# Patient Record
Sex: Female | Born: 1975
Health system: Southern US, Community
[De-identification: ages and names within clinical notes are randomized; demographics above are authoritative.]

## PROBLEM LIST (undated history)

## (undated) DIAGNOSIS — I776 Arteritis, unspecified: Secondary | ICD-10-CM

## (undated) DIAGNOSIS — N63 Unspecified lump in unspecified breast: Secondary | ICD-10-CM

## (undated) DIAGNOSIS — D849 Immunodeficiency, unspecified: Secondary | ICD-10-CM

## (undated) DIAGNOSIS — R51 Headache: Secondary | ICD-10-CM

## (undated) DIAGNOSIS — R609 Edema, unspecified: Secondary | ICD-10-CM

## (undated) DIAGNOSIS — O09529 Supervision of elderly multigravida, unspecified trimester: Secondary | ICD-10-CM

## (undated) DIAGNOSIS — F909 Attention-deficit hyperactivity disorder, unspecified type: Secondary | ICD-10-CM

## (undated) DIAGNOSIS — E669 Obesity, unspecified: Secondary | ICD-10-CM

## (undated) DIAGNOSIS — I8390 Asymptomatic varicose veins of unspecified lower extremity: Secondary | ICD-10-CM

## (undated) DIAGNOSIS — I10 Essential (primary) hypertension: Secondary | ICD-10-CM

## (undated) DIAGNOSIS — D649 Anemia, unspecified: Secondary | ICD-10-CM

## (undated) DIAGNOSIS — H547 Unspecified visual loss: Secondary | ICD-10-CM

## (undated) HISTORY — DX: Asymptomatic varicose veins of unspecified lower extremity: I83.90

## (undated) HISTORY — DX: Unspecified lump in unspecified breast: N63.0

## (undated) HISTORY — DX: Obesity, unspecified: E66.9

## (undated) HISTORY — PX: CHOLECYSTECTOMY: SHX55

## (undated) HISTORY — DX: Anemia, unspecified: D64.9

## (undated) HISTORY — DX: Supervision of elderly multigravida, unspecified trimester: O09.529

## (undated) HISTORY — DX: Headache: R51

## (undated) HISTORY — PX: COLPOSCOPY: SHX161

## (undated) HISTORY — DX: Edema, unspecified: R60.9

## (undated) HISTORY — PX: CLOSED REDUCTION SHOULDER DISLOCATION: SUR242

---

## 2002-01-01 ENCOUNTER — Encounter: Admission: RE | Admit: 2002-01-01 | Discharge: 2002-03-22 | Payer: Self-pay | Admitting: Orthopedic Surgery

## 2003-01-27 ENCOUNTER — Inpatient Hospital Stay (HOSPITAL_COMMUNITY): Admission: AD | Admit: 2003-01-27 | Discharge: 2003-01-27 | Payer: Self-pay | Admitting: Obstetrics

## 2003-02-18 ENCOUNTER — Encounter: Admission: RE | Admit: 2003-02-18 | Discharge: 2003-02-18 | Payer: Self-pay | Admitting: Gastroenterology

## 2003-02-20 ENCOUNTER — Encounter: Admission: RE | Admit: 2003-02-20 | Discharge: 2003-02-20 | Payer: Self-pay | Admitting: Gastroenterology

## 2003-03-20 ENCOUNTER — Ambulatory Visit (HOSPITAL_COMMUNITY): Admission: RE | Admit: 2003-03-20 | Discharge: 2003-03-20 | Payer: Self-pay | Admitting: Gastroenterology

## 2003-03-20 ENCOUNTER — Encounter (INDEPENDENT_AMBULATORY_CARE_PROVIDER_SITE_OTHER): Payer: Self-pay | Admitting: *Deleted

## 2003-07-03 ENCOUNTER — Ambulatory Visit (HOSPITAL_COMMUNITY): Admission: RE | Admit: 2003-07-03 | Discharge: 2003-07-03 | Payer: Self-pay | Admitting: Obstetrics

## 2005-02-28 ENCOUNTER — Encounter: Admission: RE | Admit: 2005-02-28 | Discharge: 2005-02-28 | Payer: Self-pay | Admitting: Occupational Medicine

## 2005-03-22 ENCOUNTER — Encounter: Admission: RE | Admit: 2005-03-22 | Discharge: 2005-04-04 | Payer: Self-pay | Admitting: Occupational Medicine

## 2005-05-18 ENCOUNTER — Encounter: Admission: RE | Admit: 2005-05-18 | Discharge: 2005-08-16 | Payer: Self-pay | Admitting: Occupational Medicine

## 2007-05-31 ENCOUNTER — Encounter: Admission: RE | Admit: 2007-05-31 | Discharge: 2007-05-31 | Payer: Self-pay | Admitting: Obstetrics

## 2008-06-11 ENCOUNTER — Encounter: Payer: Self-pay | Admitting: Family Medicine

## 2008-06-18 ENCOUNTER — Ambulatory Visit: Payer: Self-pay | Admitting: Family Medicine

## 2008-06-18 DIAGNOSIS — R609 Edema, unspecified: Secondary | ICD-10-CM | POA: Insufficient documentation

## 2008-06-19 LAB — CONVERTED CEMR LAB
AST: 34 units/L (ref 0–37)
Basophils Absolute: 0 10*3/uL (ref 0.0–0.1)
CO2: 30 meq/L (ref 19–32)
Chloride: 112 meq/L (ref 96–112)
Creatinine, Ser: 0.9 mg/dL (ref 0.4–1.2)
Eosinophils Absolute: 0.3 10*3/uL (ref 0.0–0.7)
HCT: 36 % (ref 36.0–46.0)
LDL Cholesterol: 87 mg/dL (ref 0–99)
Lymphs Abs: 2.2 10*3/uL (ref 0.7–4.0)
MCHC: 34.2 g/dL (ref 30.0–36.0)
Monocytes Relative: 5.6 % (ref 3.0–12.0)
Platelets: 202 10*3/uL (ref 150.0–400.0)
RDW: 12.9 % (ref 11.5–14.6)
Sodium: 143 meq/L (ref 135–145)
TSH: 4.34 microintl units/mL (ref 0.35–5.50)
Total Bilirubin: 0.8 mg/dL (ref 0.3–1.2)
Total CHOL/HDL Ratio: 3
Triglycerides: 81 mg/dL (ref 0.0–149.0)
WBC: 7 10*3/uL (ref 4.5–10.5)

## 2008-07-30 ENCOUNTER — Ambulatory Visit: Payer: Self-pay | Admitting: Family Medicine

## 2008-07-30 DIAGNOSIS — E876 Hypokalemia: Secondary | ICD-10-CM | POA: Insufficient documentation

## 2008-07-31 ENCOUNTER — Encounter (INDEPENDENT_AMBULATORY_CARE_PROVIDER_SITE_OTHER): Payer: Self-pay | Admitting: *Deleted

## 2008-07-31 LAB — CONVERTED CEMR LAB: Potassium: 3.3 meq/L — ABNORMAL LOW (ref 3.5–5.1)

## 2008-08-07 ENCOUNTER — Telehealth (INDEPENDENT_AMBULATORY_CARE_PROVIDER_SITE_OTHER): Payer: Self-pay | Admitting: *Deleted

## 2008-08-25 ENCOUNTER — Telehealth (INDEPENDENT_AMBULATORY_CARE_PROVIDER_SITE_OTHER): Payer: Self-pay | Admitting: *Deleted

## 2008-10-08 ENCOUNTER — Encounter (INDEPENDENT_AMBULATORY_CARE_PROVIDER_SITE_OTHER): Payer: Self-pay | Admitting: *Deleted

## 2008-10-08 ENCOUNTER — Ambulatory Visit: Payer: Self-pay | Admitting: Family Medicine

## 2008-10-08 LAB — CONVERTED CEMR LAB: Potassium: 4.2 meq/L (ref 3.5–5.1)

## 2008-10-21 ENCOUNTER — Ambulatory Visit: Payer: Self-pay | Admitting: Family Medicine

## 2008-11-03 ENCOUNTER — Telehealth (INDEPENDENT_AMBULATORY_CARE_PROVIDER_SITE_OTHER): Payer: Self-pay | Admitting: *Deleted

## 2008-11-03 DIAGNOSIS — G43909 Migraine, unspecified, not intractable, without status migrainosus: Secondary | ICD-10-CM | POA: Insufficient documentation

## 2008-11-05 ENCOUNTER — Encounter: Payer: Self-pay | Admitting: Internal Medicine

## 2009-01-22 ENCOUNTER — Telehealth (INDEPENDENT_AMBULATORY_CARE_PROVIDER_SITE_OTHER): Payer: Self-pay | Admitting: *Deleted

## 2009-01-26 ENCOUNTER — Ambulatory Visit: Payer: Self-pay | Admitting: Family Medicine

## 2009-01-26 LAB — CONVERTED CEMR LAB: Beta hcg, urine, semiquantitative: NEGATIVE

## 2009-05-28 ENCOUNTER — Ambulatory Visit: Payer: Self-pay | Admitting: Family Medicine

## 2009-05-28 ENCOUNTER — Other Ambulatory Visit: Admission: RE | Admit: 2009-05-28 | Discharge: 2009-05-28 | Payer: Self-pay | Admitting: Family Medicine

## 2009-05-28 DIAGNOSIS — N644 Mastodynia: Secondary | ICD-10-CM | POA: Insufficient documentation

## 2009-05-28 DIAGNOSIS — J309 Allergic rhinitis, unspecified: Secondary | ICD-10-CM | POA: Insufficient documentation

## 2009-05-28 LAB — CONVERTED CEMR LAB: Beta hcg, urine, semiquantitative: NEGATIVE

## 2009-06-04 ENCOUNTER — Telehealth (INDEPENDENT_AMBULATORY_CARE_PROVIDER_SITE_OTHER): Payer: Self-pay | Admitting: *Deleted

## 2009-11-26 ENCOUNTER — Ambulatory Visit: Payer: Self-pay | Admitting: Family Medicine

## 2009-11-26 DIAGNOSIS — F988 Other specified behavioral and emotional disorders with onset usually occurring in childhood and adolescence: Secondary | ICD-10-CM | POA: Insufficient documentation

## 2009-11-27 LAB — CONVERTED CEMR LAB
ALT: 16 units/L (ref 0–35)
Albumin: 4 g/dL (ref 3.5–5.2)
Alkaline Phosphatase: 71 units/L (ref 39–117)
Basophils Relative: 0.4 % (ref 0.0–3.0)
CO2: 28 meq/L (ref 19–32)
Chloride: 105 meq/L (ref 96–112)
Eosinophils Absolute: 0.3 10*3/uL (ref 0.0–0.7)
Eosinophils Relative: 3 % (ref 0.0–5.0)
HCT: 41.4 % (ref 36.0–46.0)
Hemoglobin: 14 g/dL (ref 12.0–15.0)
LDL Cholesterol: 122 mg/dL — ABNORMAL HIGH (ref 0–99)
MCHC: 33.9 g/dL (ref 30.0–36.0)
MCV: 93.5 fL (ref 78.0–100.0)
Monocytes Absolute: 0.5 10*3/uL (ref 0.1–1.0)
Neutro Abs: 7 10*3/uL (ref 1.4–7.7)
Potassium: 3.7 meq/L (ref 3.5–5.1)
RBC: 4.43 M/uL (ref 3.87–5.11)
Sodium: 142 meq/L (ref 135–145)
Total CHOL/HDL Ratio: 3
Total Protein: 7.1 g/dL (ref 6.0–8.3)
Triglycerides: 41 mg/dL (ref 0.0–149.0)
Vit D, 25-Hydroxy: 21 ng/mL — ABNORMAL LOW (ref 30–89)
WBC: 10.7 10*3/uL — ABNORMAL HIGH (ref 4.5–10.5)

## 2009-12-25 ENCOUNTER — Telehealth (INDEPENDENT_AMBULATORY_CARE_PROVIDER_SITE_OTHER): Payer: Self-pay | Admitting: *Deleted

## 2010-02-06 ENCOUNTER — Encounter: Payer: Self-pay | Admitting: Obstetrics & Gynecology

## 2010-02-06 ENCOUNTER — Encounter: Payer: Self-pay | Admitting: Gastroenterology

## 2010-02-07 ENCOUNTER — Encounter: Payer: Self-pay | Admitting: Gastroenterology

## 2010-02-09 ENCOUNTER — Telehealth (INDEPENDENT_AMBULATORY_CARE_PROVIDER_SITE_OTHER): Payer: Self-pay | Admitting: *Deleted

## 2010-02-16 NOTE — Assessment & Plan Note (Signed)
Summary: pap/kdc   Vital Signs:  Patient profile:   35 year old female Height:      65 inches Weight:      205 pounds BMI:     34.24 Pulse rate:   86 / minute BP sitting:   112 / 80  (left arm)  Vitals Entered By: Doristine Devoid (May 28, 2009 8:12 AM) CC: pap only   History of Present Illness: 35 yo woman here today for pap.  had CPE 7/10, pap 5/10.  no longer seeing Dr Clearance Coots.  not currently on birth control.  has not had period since 3/11.  breasts now very sore.  ? pregnancy.  pt also reports seasonal allergies are bothersome.  zyrtec provides relief but only temporary.  says that 5-6 hrs after taking meds will have itchy throat, nasal congestion.  Current Medications (verified): 1)  None  Allergies (verified): No Known Drug Allergies  Past History:  Past Medical History: Last updated: 07/30/2008 Edema  hx of benign breast mass  Social History: Last updated: 07/30/2008 married, 2 children works as Systems developer at [employer]  Review of Systems      See HPI  Physical Exam  General:  Well-developed,well-nourished,in no acute distress; alert,appropriate and cooperative throughout examination Nose:  mildly edematous turbinates Mouth:  + PND Breasts:  lump in L upper outer quadrant- pt reports this has been worked up in the past, benign R breast normal Genitalia:  Pelvic Exam:        External: normal female genitalia without lesions or masses        Vagina: normal without lesions or masses        Cervix: normal without lesions or masses        Adnexa: normal bimanual exam without masses or fullness        Uterus: normal by palpation        Pap smear: performed   Impression & Recommendations:  Problem # 1:  ROUTINE GYNECOLOGICAL EXAMINATION (ICD-V72.31) Assessment New breast and pelvic exam WNL.  breast lump on L has been worked up w/ mammogram and ultrasound- benign.  pap collected.  Problem # 2:  BREAST PAIN (ICD-611.71) Assessment: New  given amenorrhea and  breast pain checked Upreg- negative.  no abnormalities on breast exam w/ exception of known L breast lump.  ibuprofen as needed.  if pain does not improve as cycles normalize pt is to let me know.  Orders: Urine Pregnancy Test  (56387)  Problem # 3:  ALLERGIC RHINITIS (ICD-477.9) Assessment: New given pt's sxs and edematous turbinates on exam will start nasal steroid in addition to Zyrtec. Her updated medication list for this problem includes:    Nasonex 50 Mcg/act Susp (Mometasone furoate) .Marland Kitchen... 2 sprays each nostril once daily  Complete Medication List: 1)  Nasonex 50 Mcg/act Susp (Mometasone furoate) .... 2 sprays each nostril once daily  Patient Instructions: 1)  You are due for your complete physical in July- do not eat before this appt 2)  Take ibuprofen for your breast pain- if it worsens or doesn't improve as your cycle normalizes, please let me know 3)  Continue the 10mg  Zyrtec and add the Nasonex daily- this will decrease your congestion and post nasal drip 4)  Call with any questions or concerns 5)  Have a great summer!! Prescriptions: NASONEX 50 MCG/ACT SUSP (MOMETASONE FUROATE) 2 sprays each nostril once daily  #1 x 3   Entered and Authorized by:   Neena Rhymes MD   Signed by:  Neena Rhymes MD on 05/28/2009   Method used:   Electronically to        [id number]* (retail)       [address] [place], [place]  [address]       Ph: [phone]       Fax: [phone]   RxID:   [id number]   Laboratory Results   Urine Tests      Urine HCG: negative    Laboratory Results   Urine Tests      Urine HCG: negative

## 2010-02-16 NOTE — Progress Notes (Signed)
Summary: increase concerta  Phone Note Call from Patient Call back at Work Phone [phone] Call back at ext [phone]   Caller: Patient Summary of Call: Spoke w/ patient would like increase in Concerta says she first take in the am and for the first 2 weeks it was lasting until end of work day now feels like its wearing off around 2:30 and co workers have noticed also.  Initial call taken by: Doristine Devoid CMA,  December 25, 2009 2:59 PM  Follow-up for Phone Call        if sxs are still well controlled in the morning we don't need to go up on the med we need to add a short acting pill in addition to what she's taking.  we can start Ritalin 10mg  for pt to take between 1-2pm.  #30 Follow-up by: Neena Rhymes MD,  December 25, 2009 3:24 PM  Additional Follow-up for Phone Call Additional follow up Details #1::        spoke w/ patient says she doesn't want to take ritaling but would like to add 18mg  of concerta informed patient that we could do this although this would put her at max dose and we wouldn't have any room to move if up if she doesn't respond to dose. patient ok'd information and still would like to try. Additional Follow-up by: Doristine Devoid CMA,  December 25, 2009 3:33 PM    New/Updated Medications: RITALIN 10 MG TABS (METHYLPHENIDATE HCL) 1 tab daily as directed CONCERTA 18 MG CR-TABS (METHYLPHENIDATE HCL) take one tablet as directed Prescriptions: CONCERTA 18 MG CR-TABS (METHYLPHENIDATE HCL) take one tablet as directed  #30 x 0   Entered by:   Doristine Devoid CMA   Authorized by:   Neena Rhymes MD   Signed by:   Doristine Devoid CMA on 12/25/2009   Method used:   Print then Give to Patient   RxID:   [id number] RITALIN 10 MG TABS (METHYLPHENIDATE HCL) 1 tab daily as directed  #30 x 0   Entered by:   Doristine Devoid CMA   Authorized by:   Neena Rhymes MD   Signed by:   Doristine Devoid CMA on 12/25/2009   Method used:   Printed then faxed to ...       [id number]* (retail)       [address] [place], [place]  [address]       Ph: [phone]       Fax: [phone]   RxID:   [id number] RITALIN 10 MG TABS (METHYLPHENIDATE HCL) 1 tab daily as directed  #30 x 0   Entered and Authorized by:   Neena Rhymes MD   Signed by:   Neena Rhymes MD on 12/25/2009   Method used:   Historical   RxID:   [id number]

## 2010-02-16 NOTE — Assessment & Plan Note (Signed)
Summary: PAP-IUD/CDJ   Vital Signs:  Patient profile:   35 year old female Height:      65 inches Weight:      195.4 pounds BMI:     32.63 Pulse rate:   64 / minute BP sitting:   126 / 80  (left arm)  Vitals Entered By: Doristine Devoid (June 18, 2008 8:13 AM) CC:  iud insertion    History of Present Illness: 35 yo woman here today to establish and get IUD.  LMP 5/28.  no concerns about sexually transmitted infxns.  had been using NuvaRing until 2 months.  pt has had 2 IUDs previously, 1 copper T and 1 Mirena.  had pap in 5/10 w/ Dr Clearance Coots- reportedly normal.  pt w/out questions or concerns, aware of possible complications and risks- accepts them and willing to proceed.  Preventive Screening-Counseling & Management  Alcohol-Tobacco     Smoking Status: never  Allergies (verified): No Known Drug Allergies  Past History:  Past Medical History: Edema   Past Surgical History: Cholecystectomy Left shoulder surgery dislocation   Family History: CAD- no HTN- no DM- no CVA- no Colon CA- no Breast CA- no  Social History: married, 2 childrenSmoking Status:  never  Review of Systems GU:  Denies abnormal vaginal bleeding, discharge, and genital sores.  Physical Exam  General:  Well-developed,well-nourished,in no acute distress; alert,appropriate and cooperative throughout examination Genitalia:  Pelvic Exam:        External: normal female genitalia without lesions or masses        Vagina: normal without lesions or masses        Cervix: normal without lesions or masses        Adnexa: normal bimanual exam without masses or fullness        Uterus: normal by palpation        Pap smear: not performed   Impression & Recommendations:  Problem # 1:  IUD (ICD-V25.1) Assessment New  pt gave verbal consent for insertion of IUD.  Upreg (-).  cervix prepped w/ betadine x3.  tenaculum placed.  uterus sounded to 6 cm.  IUD inserted and strings trimmed to 3 cm.  pt tolerated  procedure w/out difficulty and no apparent complications.  reviewed instructions and possible complications.  Pt expresses understanding.  Orders: IUD Insertion only (V25.1) (41324) IUD Mirena (M0102)  Problem # 2:  PHYSICAL EXAMINATION (ICD-V70.0) Assessment: New labs collected for upcoming CPE. Orders: Venipuncture (72536) TLB-Lipid Panel (80061-LIPID) TLB-BMP (Basic Metabolic Panel-BMET) (80048-METABOL) TLB-CBC Platelet - w/Differential (85025-CBCD) TLB-Hepatic/Liver Function Pnl (80076-HEPATIC) TLB-TSH (Thyroid Stimulating Hormone) (84443-TSH)  Complete Medication List: 1)  Hydrochlorothiazide 12.5 Mg Tabs (Hydrochlorothiazide) .... Take one tablet daily 2)  Mirena  .... Iud  Other Orders: Urine Pregnancy Test  (64403)  Patient Instructions: 1)  Please schedule a follow-up appointment in 4-6 weeks for a physical and IUD check. 2)  Take Ibuprofen today to help with the cramping 3)  Make sure you use back up birth control for the next 4-6 weeks 4)  This does not protect against STDs 5)  Call with any questions or concerns 6)  Welcome to our office!  We're glad to have you!   Laboratory Results   Urine Tests      Urine HCG: negative

## 2010-02-16 NOTE — Progress Notes (Signed)
Summary: switch from nuva ring to depo  Phone Note Call from Patient Call back at Home Phone [phone]   Caller: Patient Summary of Call: patient called and wanted to see if she needed to do anything to switch from the nuva ring to the depo shot informed patient I didn't think that there was a waiting period but wasnt sure informed I will get information from another physician to be sure she is on the last week of her nuva  ring so she is expecting her period soon.   Dr. Laury Axon pls advise Initial call taken by: Doristine Devoid,  January 22, 2009 9:59 AM  Follow-up for Phone Call        She needs to come within first few days of period,  pregnancy test done then we can give shot---- if she is not getting period--come in for serum preg test  Follow-up by: Loreen Freud DO,  January 22, 2009 10:01 AM  Additional Follow-up for Phone Call Additional follow up Details #1::        spoke w/ patient aware and appt scheduled to have urine preg test done and receive depo shot.........Marland KitchenDoristine Devoid  January 22, 2009 2:58 PM

## 2010-02-16 NOTE — Progress Notes (Signed)
Summary: pap results  Phone Note Outgoing Call   Call placed by: Doristine Devoid,  Jun 04, 2009 1:39 PM Call placed to: Patient Summary of Call: pt w/ abnormal pap.  needs GYN f/u- if pt has previous GYN we can schedule appt for her if we know name.  if no GYN we can schedule her.  please call pt.   Follow-up for Phone Call        spoke w/ patient aware of results and that she needs to contact Dr. Verdell Carmine office for f/u informed that information was faxed to his office.....Marland KitchenMarland KitchenDoristine Devoid  Jun 04, 2009 1:41 PM

## 2010-02-16 NOTE — Assessment & Plan Note (Signed)
Summary: CPX AND FASTING LABS///SPH   Vital Signs:  Patient profile:   35 year old female Height:      66 inches Weight:      211 pounds Temp:     97.9 degrees F oral Pulse rate:   72 / minute Resp:     18 per minute BP sitting:   110 / 78  (left arm)  Vitals Entered By: Jeremy Johann CMA (November 26, 2009 8:05 AM) CC: CPX, No pap, fasting Comments already had flu shot   History of Present Illness: 35 yo woman here today for CPE.  GYNClearance Coots, had f/u colposcopy and was normal.  no concerns today.  ADHD- had complete evaluation as a child, previously on meds (can't remember the name).  took a few of daughter's left over Concerta 36 mg once her meds were switched and this improved her focus.  felt 36 mg helped but 'wasn't quite there'.  taking classes at [employer]- having difficulty focusing.    Preventive Screening-Counseling & Management  Alcohol-Tobacco     Alcohol drinks/day: 0     Smoking Status: never  Caffeine-Diet-Exercise     Does Patient Exercise: yes     Type of exercise: going to the gym      Sexual History:  currently monogamous.        Drug Use:  never.    Current Medications (verified): 1)  None  Allergies (verified): No Known Drug Allergies  Past History:  Past medical, surgical, family and social histories (including risk factors) reviewed, and no changes noted (except as noted below).  Past Medical History: Reviewed history from 07/30/2008 and no changes required. Edema  hx of benign breast mass  Past Surgical History: Reviewed history from 06/18/2008 and no changes required. Cholecystectomy Left shoulder surgery dislocation   Family History: Reviewed history from 06/18/2008 and no changes required. CAD- no HTN- no DM- no CVA- no Colon CA- no Breast CA- no  Social History: Reviewed history from 07/30/2008 and no changes required. 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    Physical Exam  General:  Well-developed,well-nourished,in no acute distress; alert,appropriate and cooperative throughout examination Head:  Normocephalic and atraumatic without obvious abnormalities. No apparent alopecia or balding. Eyes:  No corneal or conjunctival inflammation noted. EOMI. Perrla. Funduscopic exam benign, without hemorrhages, exudates or papilledema. Vision grossly normal. Ears:  External ear exam shows no significant lesions or deformities.  Otoscopic examination reveals clear canals, tympanic membranes are intact bilaterally without bulging, retraction, inflammation or discharge. Hearing is grossly normal bilaterally. Nose:  External nasal examination shows no deformity or inflammation. Nasal mucosa are pink and moist without lesions or exudates. Mouth:  Oral mucosa and oropharynx without lesions or exudates.  Teeth in good repair. Neck:  No deformities, masses, or tenderness noted. Breasts:  deferre to GYN Lungs:  Normal respiratory effort, chest expands symmetrically. Lungs are clear to auscultation, no crackles or wheezes. Heart:  Normal rate and regular rhythm. S1 and S2 normal without gallop, murmur, click, rub or other extra sounds. Abdomen:  Bowel sounds positive,abdomen soft and non-tender without masses, organomegaly or hernias noted. Genitalia:  deferred to GYN Pulses:  +2 carotid, radial, DP Extremities:  No clubbing, cyanosis, edema, or deformity noted with normal full range of motion of all joints.   Neurologic:  No cranial nerve deficits noted. Station and gait are normal. Plantar reflexes are down-going bilaterally. DTRs are  symmetrical throughout. Sensory, motor and coordinative functions appear intact. Skin:  Intact without suspicious lesions or rashes, tattoo on R shoulder and R ankle Cervical Nodes:  No lymphadenopathy noted Axillary Nodes:  No palpable lymphadenopathy Psych:  Cognition and judgment appear intact. Alert and cooperative with normal attention span and concentration. No apparent delusions, illusions, hallucinations   Impression & Recommendations:  Problem # 1:  PHYSICAL EXAMINATION (ICD-V70.0) Assessment Unchanged pt's PE WNL.  discussed importance of diet and exercise.  check labs. Orders: Venipuncture (45409) TLB-Lipid Panel (80061-LIPID) TLB-BMP (Basic Metabolic Panel-BMET) (80048-METABOL) TLB-CBC Platelet - w/Differential (85025-CBCD) TLB-Hepatic/Liver Function Pnl (80076-HEPATIC) TLB-TSH (Thyroid Stimulating Hormone) (84443-TSH) T-Vitamin D (25-Hydroxy) (81191-47829)  Problem # 2:  ADD (ICD-314.00) Assessment: New pt reports hx of this while in school, previously on meds.  did not need meds when she was no longer in school but is now taking classes plus working full time.  had some results w/ Concerta 36mg  but did not feel sxs were completely controlled.  will increase to 54mg  daily and follow.  Complete Medication List: 1)  Concerta 54 Mg Cr-tabs (Methylphenidate hcl) .Marland Kitchen.. 1 tab by mouth daily  Patient Instructions: 1)  Follow up in 1-2 months to discuss your new Concerta dose 2)  We'll notify you of your lab results 3)  You must pick up your concerta monthly b/c it's a controlled substance 4)  Call with any questions or concerns 5)  Happy Thanksgiving!!! Prescriptions: CONCERTA 54 MG CR-TABS (METHYLPHENIDATE HCL) 1 tab by mouth daily  #30 x 0   Entered and Authorized by:   Neena Rhymes MD   Signed by:   Neena Rhymes MD on 11/26/2009   Method used:   Print then Give to Patient   RxID:   [id number]    Orders Added: 1)  Venipuncture [95284] 2)  TLB-Lipid  Panel [80061-LIPID] 3)  TLB-BMP (Basic Metabolic Panel-BMET) [80048-METABOL] 4)  TLB-CBC Platelet - w/Differential [85025-CBCD] 5)  TLB-Hepatic/Liver Function Pnl [80076-HEPATIC] 6)  TLB-TSH (Thyroid Stimulating Hormone) [84443-TSH] 7)  T-Vitamin D (25-Hydroxy) [13244-01027] 8)  Est. Patient 18-39 years [99395]  Appended Document: CPX AND FASTING LABS///SPH

## 2010-02-16 NOTE — Assessment & Plan Note (Signed)
Summary: cpx chekc iud/cbs   Vital Signs:  Patient profile:   35 year old female Height:      65 inches Weight:      186.8 pounds BMI:     31.20 Pulse rate:   68 / minute BP sitting:   122 / 70  (left arm)  Vitals Entered By: Doristine Devoid (July 30, 2008 8:02 AM) CC: cpx and iud check   History of Present Illness: 35 yo woman here today for CPE and IUD recheck.  no problems w/ IUD.  last visit K+ was a little low but pt did not start pills- increased amount of K+ in diet.  no other health concerns.  saw Dr Clearance Coots in May for breast and pelvic exam.  Preventive Screening-Counseling & Management  Alcohol-Tobacco     Alcohol drinks/day: <1     Smoking Status: never  Caffeine-Diet-Exercise     Does Patient Exercise: yes     Type of exercise: gym     Exercise (avg: min/session): >60     Times/week: 5      Sexual History:  currently monogamous.        Drug Use:  never.    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    Physical Exam  General:  Well-developed,well-nourished,in no acute distress; alert,appropriate and cooperative throughout examination Head:  Normocephalic and atraumatic without obvious abnormalities. No apparent alopecia or  balding. Eyes:  No corneal or conjunctival inflammation noted. EOMI. Perrla. Funduscopic exam benign, without hemorrhages, exudates or papilledema. Vision grossly normal. Ears:  External ear exam shows no significant lesions or deformities.  Otoscopic examination reveals clear canals, tympanic membranes are intact bilaterally without bulging, retraction, inflammation or discharge. Hearing is grossly normal bilaterally. Nose:  External nasal examination shows no deformity or inflammation. Nasal mucosa are pink and moist without lesions or exudates. Mouth:  Oral mucosa and oropharynx without lesions or exudates.  Teeth in good repair. Neck:  No deformities, masses, or tenderness noted. Breasts:  deferred to gyn Lungs:  Normal respiratory effort, chest expands symmetrically. Lungs are clear to auscultation, no crackles or wheezes. Heart:  Normal rate and regular rhythm. S1 and S2 normal without gallop, murmur, click, rub or other extra sounds. Abdomen:  Bowel sounds positive,abdomen soft and non-tender without masses, organomegaly or hernias noted. Genitalia:  IUD strings visible and still in place Msk:  No deformity or scoliosis noted of thoracic or lumbar spine.   Pulses:  +2 carotid, radial, DP Extremities:  No clubbing, cyanosis, edema, or deformity noted with normal full range of motion of all joints.   Neurologic:  No cranial nerve deficits noted. Station and gait are normal. Plantar reflexes are down-going bilaterally. DTRs are symmetrical throughout. Sensory, motor and coordinative functions appear intact. Skin:  Intact without suspicious lesions or rashes, tattoo on R shoulder Cervical Nodes:  No lymphadenopathy noted Inguinal Nodes:  No significant adenopathy Psych:  Cognition and judgment appear intact. Alert and cooperative with normal attention span and concentration. No apparent delusions, illusions, hallucinations   Impression & Recommendations:  Problem # 1:  PHYSICAL EXAMINATION  (ICD-V70.0) Assessment Unchanged pt's PE WNL.  labs from last visit WNL (with exception of K+, see below).  IUD in place.  anticipatory guidance provided.  pt UTD on health maintainence.  Problem # 2:  HYPOKALEMIA (ICD-276.8) Assessment: New did not start pills but increased K+ in diet.  will recheck. Orders: Venipuncture (16109) TLB-Potassium (K+) (84132-K)  Complete Medication List: 1)  Hydrochlorothiazide 12.5 Mg Tabs (Hydrochlorothiazide) .... Take one tablet daily 2)  Mirena  .... Iud  Patient Instructions: 1)  Please schedule a follow-up appointment in 1 year or as needed. 2)  Keep up the good work on diet and exercise- you look great! 3)  IUD is in good position- call if you have any concerns 4)  Have a great summer!!! 5)

## 2010-02-16 NOTE — Assessment & Plan Note (Signed)
Summary: [facility] AND DEPO SHOT/CDJ  Nurse Visit   Allergies: No Known Drug Allergies Laboratory Results   Urine Tests   Date/Time Reported: January 26, 2009 1:29 PM     Urine HCG: negative Comments: neg. urine preg.    Medication Administration  Injection # 1:    Medication: Depo-Provera 150mg     Diagnosis: CONTRACEPTIVE MANAGEMENT (ICD-V25.09)    Route: IM    Site: R deltoid    Exp Date: 03/2011    Lot #: [id number]    Mfr: [email or web address]    Patient tolerated injection without complications    Given by: Floydene Flock (January 26, 2009 1:31 PM)  Orders Added: 1)  Urine Pregnancy Test  [81025] 2)  Admin of Therapeutic Inj  intramuscular or subcutaneous [96372] 3)  Depo-Provera 150mg  [J1055]  Laboratory Results   Urine Tests      Urine HCG: negative Comments: neg. urine preg.      Medication Administration  Injection # 1:    Medication: Depo-Provera 150mg     Diagnosis: CONTRACEPTIVE MANAGEMENT (ICD-V25.09)    Route: IM    Site: R deltoid    Exp Date: 03/2011    Lot #: [id number]    Mfr: [email or web address]    Patient tolerated injection without complications    Given by: Floydene Flock (January 26, 2009 1:31 PM)  Orders Added: 1)  Urine Pregnancy Test  [81025] 2)  Admin of Therapeutic Inj  intramuscular or subcutaneous [96372] 3)  Depo-Provera 150mg  [J1055]

## 2010-02-16 NOTE — Progress Notes (Signed)
Summary: referral for migraines  Phone Note Call from Patient   Caller: Patient Summary of Call: says she has had problems w/ migraines previous hx of migraines was on meds in the past and wanted to be [facility] will get information from previous MD to sign release of records. Initial call taken by: Doristine Devoid,  November 03, 2008 1:46 PM  New Problems: MIGRAINE HEADACHE (ICD-346.90)   New Problems: MIGRAINE HEADACHE (ICD-346.90)

## 2010-02-18 NOTE — Progress Notes (Signed)
Summary: refill  Phone Note Refill Request   Refills Requested: Medication #1:  CONCERTA 54 MG CR-TABS 1 tab by mouth daily  Medication #2:  CONCERTA 18 MG CR-TABS take one tablet as directed. Pt aware Rx ready for pick up.............Marland KitchenFelecia Deloach CMA  February 09, 2010 10:50 AM      Prescriptions: CONCERTA 18 MG CR-TABS (METHYLPHENIDATE HCL) take one tablet as directed  #30 x 0   Entered by:   Jeremy Johann CMA   Authorized by:   Loreen Freud DO   Signed by:   Jeremy Johann CMA on 02/09/2010   Method used:   Printed then faxed to ...       [facility] [address] [id number]* (retail)       [address] [place], [place]  [address]       Ph: [phone]       Fax: [phone]   RxID:   [id number] CONCERTA 54 MG CR-TABS (METHYLPHENIDATE HCL) 1 tab by mouth daily  #30 x 0   Entered by:   Jeremy Johann CMA   Authorized by:   Loreen Freud DO   Signed by:   Jeremy Johann CMA on 02/09/2010   Method used:   Printed then faxed to ...       [facility] [address] #[id number]* (retail)       [address] [place] [address] [place], [place]  [address]       Ph: [phone]       Fax: [phone]   RxID:   [id number]

## 2010-03-18 ENCOUNTER — Telehealth: Payer: Self-pay | Admitting: Family Medicine

## 2010-03-25 NOTE — Progress Notes (Signed)
Summary: refill--Concerta  Phone Note Refill Request Call back at Home Phone [phone]   Refills Requested: Medication #1:  CONCERTA 54 MG CR-TABS 1 tab by mouth daily  Medication #2:  CONCERTA 18 MG CR-TABS take one tablet as directed. Initial call taken by: Jeremy Johann CMA,  March 18, 2010 10:13 AM  Follow-up for Phone Call        Please advise. Lucious Groves CMA  March 18, 2010 10:22 AM   Additional Follow-up for Phone Call Additional follow up Details #1::        ok for #30 of each w/ no refills Additional Follow-up by: Neena Rhymes MD,  March 18, 2010 10:47 AM    Additional Follow-up for Phone Call Additional follow up Details #2::    Patient aware prescription will be placed up front shortly. Lucious Groves CMA  March 18, 2010 11:26 AM   Prescriptions: CONCERTA 18 MG CR-TABS (METHYLPHENIDATE HCL) take one tablet as directed  #30 x 0   Entered by:   Lucious Groves CMA   Authorized by:   Neena Rhymes MD   Signed by:   Lucious Groves CMA on 03/18/2010   Method used:   Print then Give to Patient   RxID:   [id number] CONCERTA 54 MG CR-TABS (METHYLPHENIDATE HCL) 1 tab by mouth daily  #30 x 0   Entered by:   Lucious Groves CMA   Authorized by:   Neena Rhymes MD   Signed by:   Lucious Groves CMA on 03/18/2010   Method used:   Print then Give to Patient   RxID:   [id number]

## 2010-04-27 ENCOUNTER — Telehealth: Payer: Self-pay | Admitting: Family Medicine

## 2010-04-27 NOTE — Telephone Encounter (Signed)
Last refilled 03/18/10. Last OV- 11/26/09 was supposed to f/u in 1-2 months after then. No pending appts. Please advise.

## 2010-04-27 NOTE — Telephone Encounter (Signed)
Patient needs prescription for Concerta 54   As well as prescription for Concerta 18---advised her it would be ready after noon tomorrow unless she gets a phone call

## 2010-04-28 MED ORDER — METHYLPHENIDATE HCL ER (OSM) 18 MG PO TBCR: 18.0000 mg | EXTENDED_RELEASE_TABLET | Freq: Every day | ORAL | Status: DC

## 2010-04-28 MED ORDER — METHYLPHENIDATE HCL ER (OSM) 54 MG PO TBCR: 54.0000 mg | EXTENDED_RELEASE_TABLET | Freq: Every day | ORAL | Status: DC

## 2010-04-28 NOTE — Telephone Encounter (Signed)
RX printed and placed up front for pick up, notation made that appt is needed in May.

## 2010-04-28 NOTE — Telephone Encounter (Signed)
Ok for refill.  Should make appt for may (6 months after her Nov CPE) to discuss her meds.

## 2010-05-14 ENCOUNTER — Encounter: Payer: Self-pay | Admitting: *Deleted

## 2010-05-14 ENCOUNTER — Encounter: Payer: Self-pay | Admitting: Family Medicine

## 2010-05-24 ENCOUNTER — Ambulatory Visit: Payer: Self-pay | Admitting: Family Medicine

## 2010-06-04 NOTE — Op Note (Signed)
NAMERAJA, CAPUTI NO.:  [id number]   MEDICAL RECORD NO.:  [id number]                   PATIENT TYPE:  AMB   LOCATION:  ENDO                                 FACILITY:  [facility]   PHYSICIAN:  Graylin Shiver, M.D.                DATE OF BIRTH:  1975-08-12   DATE OF PROCEDURE:  03/20/2003  DATE OF DISCHARGE:                                 OPERATIVE REPORT   PROCEDURE:  Esophagogastroduodenoscopy with biopsy.   INDICATIONS FOR PROCEDURE:  Epigastric pain and heartburn despite taking  Nexium.   Informed consent was obtained after explanation of the risks of bleeding,  infection and perforation.   PREMEDICATION:  Fentanyl 50 mcg IV, Versed 4 mg IV.   DESCRIPTION OF PROCEDURE:  With the patient in the left lateral decubitus  position, the Olympus gastroscope was inserted into the oropharynx and  passed into the esophagus. It was advanced down the esophagus, then into the  stomach and into the duodenum. The second portion and bulb of the duodenum  were normal. The stomach showed a diffuse erythematous appearance to the  mucosa compatible with gastritis. Biopsies were obtained randomly from the  stomach for histological inspection and to look for evidence of helicobacter  pylori. No lesions were seen in the fundus or cardia. On retroflexion, the  esophagus revealed the esophagogastric junction to be at 39 cm, the  esophageal mucosa looked normal in its entirety as did the esophagogastric  junction. She tolerated the procedure well without complications.   IMPRESSION:  Gastritis.   PLAN:  The biopsies will be checked. She will remain on Nexium for now.                                               Graylin Shiver, M.D.    Germain Osgood  D:  03/20/2003  T:  03/20/2003  Job:  [id number]   cc:   Leonette Most A. Clearance Coots, M.D.  Fax: [phone]

## 2010-06-16 ENCOUNTER — Ambulatory Visit: Payer: Self-pay | Admitting: Family Medicine

## 2010-06-16 DIAGNOSIS — Z0289 Encounter for other administrative examinations: Secondary | ICD-10-CM

## 2010-07-06 LAB — ANTIBODY SCREEN: Antibody Screen: NEGATIVE

## 2010-07-06 LAB — CBC: Hemoglobin: 12.6 g/dL (ref 12.0–16.0)

## 2010-07-06 LAB — GC/CHLAMYDIA PROBE AMP, GENITAL
Chlamydia: NEGATIVE
Gonorrhea: NEGATIVE

## 2010-07-06 LAB — HIV ANTIBODY (ROUTINE TESTING W REFLEX): HIV: NONREACTIVE

## 2010-07-06 LAB — RPR: RPR: NONREACTIVE

## 2010-07-06 LAB — RUBELLA ANTIBODY, IGM: Rubella: IMMUNE

## 2010-07-06 LAB — HEPATITIS B SURFACE ANTIGEN: Hepatitis B Surface Ag: NEGATIVE

## 2010-08-17 ENCOUNTER — Other Ambulatory Visit: Payer: Self-pay | Admitting: Obstetrics

## 2010-08-17 DIAGNOSIS — O09529 Supervision of elderly multigravida, unspecified trimester: Secondary | ICD-10-CM

## 2010-08-26 ENCOUNTER — Encounter (HOSPITAL_COMMUNITY): Payer: Self-pay

## 2010-08-26 ENCOUNTER — Ambulatory Visit (HOSPITAL_COMMUNITY)
Admission: RE | Admit: 2010-08-26 | Discharge: 2010-08-26 | Disposition: A | Discharge status: Home / Self Care | Payer: BC Managed Care – PPO | Source: Ambulatory Visit | Attending: Obstetrics | Admitting: Obstetrics

## 2010-08-26 ENCOUNTER — Ambulatory Visit (HOSPITAL_COMMUNITY)
Admission: RE | Admit: 2010-08-26 | Discharge: 2010-08-26 | Disposition: A | Discharge status: Home / Self Care | Payer: BC Managed Care – PPO | Source: Ambulatory Visit

## 2010-08-26 VITALS — BP 120/83 | HR 87 | Wt 214.0 lb

## 2010-08-26 DIAGNOSIS — Z1389 Encounter for screening for other disorder: Secondary | ICD-10-CM | POA: Insufficient documentation

## 2010-08-26 DIAGNOSIS — O09529 Supervision of elderly multigravida, unspecified trimester: Secondary | ICD-10-CM | POA: Insufficient documentation

## 2010-08-26 DIAGNOSIS — Z363 Encounter for antenatal screening for malformations: Secondary | ICD-10-CM | POA: Insufficient documentation

## 2010-08-26 DIAGNOSIS — O358XX0 Maternal care for other (suspected) fetal abnormality and damage, not applicable or unspecified: Secondary | ICD-10-CM | POA: Insufficient documentation

## 2010-08-26 NOTE — Progress Notes (Signed)
Ultrasound in AS/OBGYN/EPIC.  Follow up U/S scheduled 4 weeks to assess fetal gender, heart detail, growth.

## 2010-08-26 NOTE — ED Notes (Signed)
Genetic Counseling  High-Risk Gestation Note  Appointment Date:  08/26/2010 Referred By: Ana Bad, MD Date of Birth:  12/19/1975 Partner:  Ana Gray  I met with [name] and her partner, Mr. Ana Gray, for prenatal genetic counseling given advanced maternal age. The patient was also accompanied by her Gray.   They were counseled regarding maternal age and the association with risk for chromosome conditions due to nondisjunction with aging of the ova.   We reviewed chromosomes, nondisjunction, and the associated 1 in 49 risk for fetal aneuploidy in the second trimester related to a maternal age of 61 years at delivery.  They were counseled that the risk for aneuploidy decreases as gestational age increases, accounting for those pregnancies which spontaneously abort.  We specifically discussed Down syndrome (trisomy 31), trisomies 38 and 68, and sex chromosome aneuploidies (47,XXX and 47,XXY) including the common features and prognoses of each.    [name] previously had a Quad screen performed in the pregnancy, which was screen negative for the the 3 conditions screened. We discussed that the Quad screen is able to adjust a person's baseline (age related) chance for specific chromosome conditions. Quad screen is not diagnostic nor does it screen for all chromosome conditions. Based on this test, her chance for Down syndrome was reduced from her age related chance of 1 in 45 to 1 in 50; the Quad screen also reduced the chance for Trisomy 62 from her age related chance of to 1 in 53,800.  We reviewed available screening option of targeted ultrasound and diagnostic option of amniocentesis.  We discussed the risks, limitations, and benefits of each.  It was discussed that not all birth defects or genetic conditions can be identified with these procedures.  A risk of 1 in 200-300 was given for amniocentesis, the primary complication being spontaneous pregnancy loss. They were  counseled that 50-80% of fetuses with Down syndrome and up to 90% of fetuses with trisomies 13 and 18, when well visualized, have detectable anomalies or soft markers by ultrasound.   Ultrasound today confirmed the pregnancy to be 17 weeks and 6 days gestation.  An echogenic intracardiac focus (EIF) was identified at this time. Fetal aortic arch and fetal gender were unable to be visualized today. Complete ultrasound results reported separately.  An isolated echogenic focus is generally believed to be a normal variation without any concerns for the pregnancy.  Isolated echogenic cardiac foci are not associated with congenital heart defects in the baby or compromised cardiac function after birth.  However, an echogenic cardiac focus is associated with a slightly increased chance for Down syndrome in the pregnancy. Thus, the presence of an EIF would increase the risk for Down syndrome above the patient's Quad screen result of 1 in 4200 to 1 in  2100, which is still less than the patient's a priori age-related risk.  After reviewing these options and available information, [name] elected targeted ultrasound only and declined amniocentesis.  They understand that although the ultrasound may appear normal, the risk of anomalies cannot be completely eliminated.A follow-up ultrasound was planned in 4 weeks to complete fetal anatomic survey.   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  The patient reported a female paternal first cousin born with bilateral blindness. She is otherwise healthy, and an underlying etiology is not known. No additional relatives were reported with blindness. We discussed that there can be various causes for congenital blindness including genetic and environmental  causes. Without a known etiology we are unable to screen or test for congenital blindness or accurately assess recurrence risk to the current pregnancy.  Further genetic counseling  is warranted if more information is obtained.  We discussed cystic fibrosis (CF) including: the features of CF, the 1 in 46 carrier frequency in the Hispanic population, the 1 in 65 carrier frequency in the African American population, autosomal recessive inheritance, and the 25% chance of having a baby with CF if both parents are carriers of CF.  We also discussed the option of carrier testing including the pros and cons of carrier testing, as well as the option of prenatal testing if needed. CF is included on the newborn screening panel in [place]. After careful consideration, [name] elected to proceed with CF carrier screening at this time.   The patient denied exposure to environmental toxins or chemical agents.  She denied the use of alcohol, tobacco or street drugs.  She denied significant viral illnesses during the course of her pregnancy.  Her medical and surgical history were noncontributory.    I counseled the patient for approximately 40 minutes regarding the above risks.     Ana Gray No, MS, [facility] 08/26/2010

## 2010-09-02 ENCOUNTER — Other Ambulatory Visit: Payer: Self-pay | Admitting: Obstetrics and Gynecology

## 2010-09-16 ENCOUNTER — Telehealth (HOSPITAL_COMMUNITY): Payer: Self-pay | Admitting: MS"

## 2010-09-16 NOTE — Telephone Encounter (Signed)
Cystic Fibrosis carrier screening is negative for the 97 mutations analyzed. The patient's risk to be a CF carrier has been reduced from 1 in 46 to 1 in 205. Risk for CF in pregnancy has been significantly reduced. Results to patient.

## 2010-09-23 ENCOUNTER — Ambulatory Visit (HOSPITAL_COMMUNITY)
Admission: RE | Admit: 2010-09-23 | Discharge: 2010-09-23 | Disposition: A | Discharge status: Home / Self Care | Payer: BC Managed Care – PPO | Source: Ambulatory Visit | Attending: Obstetrics | Admitting: Obstetrics

## 2010-09-23 ENCOUNTER — Encounter (HOSPITAL_COMMUNITY): Payer: Self-pay

## 2010-09-23 VITALS — BP 129/75 | HR 89 | Wt 223.0 lb

## 2010-09-23 DIAGNOSIS — O09529 Supervision of elderly multigravida, unspecified trimester: Secondary | ICD-10-CM

## 2010-09-23 DIAGNOSIS — O4290 Premature rupture of membranes, unspecified as to length of time between rupture and onset of labor, unspecified weeks of gestation: Secondary | ICD-10-CM | POA: Insufficient documentation

## 2010-09-23 NOTE — Progress Notes (Signed)
Report in AS-OBGYN/EPIC; follow-up as needed 

## 2010-09-24 ENCOUNTER — Encounter (HOSPITAL_COMMUNITY): Payer: Self-pay | Admitting: *Deleted

## 2010-09-24 ENCOUNTER — Inpatient Hospital Stay (HOSPITAL_COMMUNITY)
Admission: AD | Admit: 2010-09-24 | Discharge: 2010-09-24 | Disposition: A | Discharge status: Home / Self Care | Payer: BC Managed Care – PPO | Source: Ambulatory Visit | Attending: Obstetrics & Gynecology | Admitting: Obstetrics & Gynecology

## 2010-09-24 ENCOUNTER — Inpatient Hospital Stay (HOSPITAL_COMMUNITY): Payer: BC Managed Care – PPO

## 2010-09-24 DIAGNOSIS — N72 Inflammatory disease of cervix uteri: Secondary | ICD-10-CM

## 2010-09-24 DIAGNOSIS — O2390 Unspecified genitourinary tract infection in pregnancy, unspecified trimester: Secondary | ICD-10-CM | POA: Insufficient documentation

## 2010-09-24 DIAGNOSIS — J45909 Unspecified asthma, uncomplicated: Secondary | ICD-10-CM | POA: Insufficient documentation

## 2010-09-24 DIAGNOSIS — O99340 Other mental disorders complicating pregnancy, unspecified trimester: Secondary | ICD-10-CM | POA: Insufficient documentation

## 2010-09-24 DIAGNOSIS — F909 Attention-deficit hyperactivity disorder, unspecified type: Secondary | ICD-10-CM | POA: Insufficient documentation

## 2010-09-24 DIAGNOSIS — O99891 Other specified diseases and conditions complicating pregnancy: Secondary | ICD-10-CM | POA: Insufficient documentation

## 2010-09-24 HISTORY — DX: Attention-deficit hyperactivity disorder, unspecified type: F90.9

## 2010-09-24 LAB — URINALYSIS, ROUTINE W REFLEX MICROSCOPIC
Bilirubin Urine: NEGATIVE
Glucose, UA: NEGATIVE mg/dL
Ketones, ur: NEGATIVE mg/dL
Protein, ur: NEGATIVE mg/dL
pH: 6.5 (ref 5.0–8.0)

## 2010-09-24 LAB — WET PREP, GENITAL: Clue Cells Wet Prep HPF POC: NONE SEEN

## 2010-09-24 MED ORDER — AZITHROMYCIN 1 G PO PACK
1.0000 g | PACK | Freq: Once | ORAL | Status: DC
  Filled 2010-09-24: qty 1

## 2010-09-24 MED ORDER — CEFTRIAXONE SODIUM 250 MG IJ SOLR
250.0000 mg | Freq: Once | INTRAMUSCULAR | Status: DC
  Filled 2010-09-24: qty 250

## 2010-09-24 NOTE — Progress Notes (Signed)
Pt declines antibiotics for possible cervicitis; prefers to wait for lab results; explained risks on non-treatment if cultures are positive.

## 2010-09-24 NOTE — Consult Note (Signed)
Consulted with Dr. Enrigue Catena, reviewed HPI/Exam/US/labs>give rocephin and zithromax.

## 2010-09-24 NOTE — Progress Notes (Signed)
Pelvic pressure, started last night.  Also had a mucous/watery discharge. No bleeding.

## 2010-09-24 NOTE — ED Provider Notes (Signed)
History     Chief Complaint  Patient presents with  . Vaginal Discharge   HPI  Noticed a brown tinged, watery discharge at 2100 last night.  Watery discharge has continued until now.  Denies recent sexual intercourse.  +pelvic pressure and lower back pain.  Denies vaginal bleeding or contractions.  +fetal movement.    Past Medical History  Diagnosis Date  . Edema   . Breast mass in female     benign  . Asthma   . ADHD (attention deficit hyperactivity disorder)     Past Surgical History  Procedure Date  . Cholecystectomy   . Closed reduction shoulder dislocation     left    Family History  Problem Relation Age of Onset  . Other      non contributory    History  Substance Use Topics  . Smoking status: Never Smoker   . Smokeless tobacco: Not on file  . Alcohol Use: No    Allergies: No Known Allergies  Prescriptions prior to admission  Medication Sig Dispense Refill  . prenatal vitamin w/FE, FA (PRENATAL 1 + 1) 27-1 MG TABS Take 1 tablet by mouth daily.        . ergocalciferol (VITAMIN D2) 50000 UNITS capsule Take 50,000 Units by mouth once a week.        . methylphenidate (CONCERTA) 18 MG CR tablet Take 1 tablet (18 mg total) by mouth daily.  30 tablet  0  . methylphenidate (CONCERTA) 54 MG CR tablet Take 54 mg by mouth daily.        Marland Kitchen PRENATAL VITAMINS PO Take by mouth.        . DISCONTD: methylphenidate (CONCERTA) 54 MG CR tablet Take 1 tablet (54 mg total) by mouth daily.  30 tablet  0    Review of Systems  Constitutional: Negative.   Respiratory: Negative.   Gastrointestinal: Positive for abdominal pain (lower pelvic).  Genitourinary: Negative.    Physical Exam   Blood pressure 126/87, pulse 93, temperature 98.8 F (37.1 C), temperature source Oral, resp. rate 20, height 5\' 5"  (1.651 m), weight 100.789 kg (222 lb 3.2 oz), last menstrual period 04/23/2010.  Physical Exam  Constitutional: She is oriented to person, place, and time. She appears  well-developed and well-nourished.  HENT:  Head: Normocephalic.  Neck: Normal range of motion. Neck supple.  Cardiovascular: Normal rate, regular rhythm and normal heart sounds.   Respiratory: Effort normal and breath sounds normal.  GI: There is no tenderness.  Genitourinary: No bleeding around the vagina. Vaginal discharge (mucusy, no pooling) found.       Cervix - closed/thick  Neurological: She is alert and oriented to person, place, and time. She has normal reflexes.  Skin: Skin is warm and dry.    MAU Course  Procedures US>normal fluid, cervical length 4cm IM Rocephin PO Zithromax  Assessment and Plan  Cervicitis  Plan: DC to home Follow-up in office next week  [facility] 09/24/2010, 10:31 AM

## 2010-09-24 NOTE — Progress Notes (Signed)
Pt states watery mucus discharge started last night, continues today.  " I feel like I've peed myself."

## 2010-09-25 LAB — GC/CHLAMYDIA PROBE AMP, GENITAL
Chlamydia, DNA Probe: NEGATIVE
GC Probe Amp, Genital: NEGATIVE

## 2010-12-25 ENCOUNTER — Inpatient Hospital Stay (HOSPITAL_COMMUNITY)
Admission: AD | Admit: 2010-12-25 | Discharge: 2010-12-25 | Disposition: A | Discharge status: Home / Self Care | Payer: BC Managed Care – PPO | Source: Ambulatory Visit | Attending: Obstetrics and Gynecology | Admitting: Obstetrics and Gynecology

## 2010-12-25 ENCOUNTER — Encounter (HOSPITAL_COMMUNITY): Payer: Self-pay

## 2010-12-25 DIAGNOSIS — B373 Candidiasis of vulva and vagina: Secondary | ICD-10-CM | POA: Insufficient documentation

## 2010-12-25 DIAGNOSIS — O09529 Supervision of elderly multigravida, unspecified trimester: Secondary | ICD-10-CM

## 2010-12-25 DIAGNOSIS — B3731 Acute candidiasis of vulva and vagina: Secondary | ICD-10-CM

## 2010-12-25 DIAGNOSIS — O2390 Unspecified genitourinary tract infection in pregnancy, unspecified trimester: Secondary | ICD-10-CM | POA: Insufficient documentation

## 2010-12-25 LAB — WET PREP, GENITAL
Clue Cells Wet Prep HPF POC: NONE SEEN
Trich, Wet Prep: NONE SEEN

## 2010-12-25 MED ORDER — TERCONAZOLE 0.4 % VA CREA: 1.0000 | TOPICAL_CREAM | Freq: Every day | VAGINAL | Status: AC

## 2010-12-25 NOTE — Progress Notes (Signed)
Pt reports having a gush of fluid this morning 0430 this morning with some contractions. Reports no more leaking but still having contractions. Reports good fetal movment.

## 2010-12-25 NOTE — ED Provider Notes (Signed)
History     Chief Complaint  Patient presents with  . Rupture of Membranes   HPI Pt presents for c/o possible SROM.  Reports had "gush" of clear fluid this AM but none since.  Denies bldg.  Reports 2-3 contractions per hour.  Reports she had intercourse approx 1hr prior to episode of fluid leakage.  Denies itching, burning or odor.  Reports normal fetal movement.    OB History    Grav Para Term Preterm Abortions TAB SAB Ect Mult Living   4 2 2  0 1 0 1 0 0 2      Past Medical History  Diagnosis Date  . Edema   . Breast mass in female     benign  . Asthma   . ADHD (attention deficit hyperactivity disorder)     Past Surgical History  Procedure Date  . Cholecystectomy   . Closed reduction shoulder dislocation     left    Family History  Problem Relation Age of Onset  . Other      non contributory    History  Substance Use Topics  . Smoking status: Never Smoker   . Smokeless tobacco: Not on file  . Alcohol Use: No    Allergies: No Known Allergies  Prescriptions prior to admission  Medication Sig Dispense Refill  . prenatal vitamin w/FE, FA (PRENATAL 1 + 1) 27-1 MG TABS Take 1 tablet by mouth daily.          Review of Systems  Constitutional: Negative.   HENT: Negative.   Eyes: Negative.   Respiratory: Negative.   Cardiovascular: Negative.   Gastrointestinal: Negative.   Genitourinary: Negative.   Musculoskeletal: Negative.   Skin: Negative.   Neurological: Negative.   Endo/Heme/Allergies: Negative.   Psychiatric/Behavioral: Negative.    Physical Exam   Blood pressure 137/85, pulse 101, temperature 98.6 F (37 C), temperature source Oral, resp. rate 18, height 5\' 5"  (1.651 m), weight 110.315 kg (243 lb 3.2 oz), last menstrual period 04/23/2010.  Physical Exam  Constitutional: She appears well-developed and well-nourished.  HENT:  Head: Normocephalic and atraumatic.  Right Ear: External ear normal.  Left Ear: External ear normal.  Nose: Nose  normal.  Eyes: Conjunctivae are normal. Pupils are equal, round, and reactive to light.  Neck: Normal range of motion. Neck supple. No thyromegaly present.  Cardiovascular: Normal rate, regular rhythm and intact distal pulses.   Respiratory: Effort normal and breath sounds normal.  GI: Soft. Bowel sounds are normal.  Genitourinary: Vaginal discharge found.       Ext genitalia WNL.  BUS neg.  Perineum dry.  Sm amt thick clumpy white d/c in vault.  Cx without lesions or discharge.  Vag without redness.  Nml rugae present.  Gravid uterus.      Musculoskeletal: Normal range of motion.  Neurological: She is alert. She has normal reflexes.  Skin: Skin is warm and dry.  Psychiatric: She has a normal mood and affect. Her behavior is normal. Judgment and thought content normal.   FHR baseline 150bpm.  Moderate variability present.  Accels present.  No decels present. 1 uterine contraction noted in 30 minutes.  SVE:  Ext os 2cm/Int os closed Effacement: 50% Station: -2   MAU Course  Procedures On unit wet prep with positive yeast, neg clue, neg trich Results for orders placed during the hospital encounter of 12/25/10 (from the past 24 hour(s))  WET PREP, GENITAL     Status: Abnormal   Collection Time  12/25/10  7:38 PM      Component Value Range   Yeast, Wet Prep NONE SEEN  NONE SEEN    Trich, Wet Prep NONE SEEN  NONE SEEN    Clue Cells, Wet Prep NONE SEEN  NONE SEEN    WBC, Wet Prep HPF POC MANY (*) NONE SEEN   AMNISURE RUPTURE OF MEMBRANE (ROM)     Status: Normal   Collection Time   12/25/10  7:38 PM      Component Value Range   Amnisure ROM NEGATIVE     GBS and GC/Chl collected  Assessment and Plan  IUP at 35 1/[redacted]wks pregnant Vulvovaginal candidiasis  Discharged to home. Rx Terazol 7, #1 with no rf with instructions. Vag discharge in pregnancy discussed with pt. Revd FKC and s/s labor. F/U this coming week for ROB as sched.   Zhamir Pirro O. 12/25/2010, 7:41 PM

## 2010-12-27 LAB — GC/CHLAMYDIA PROBE AMP, GENITAL
Chlamydia, DNA Probe: NEGATIVE
GC Probe Amp, Genital: NEGATIVE

## 2010-12-29 LAB — CULTURE, BETA STREP (GROUP B ONLY)

## 2011-01-14 ENCOUNTER — Telehealth (HOSPITAL_COMMUNITY): Payer: Self-pay | Admitting: *Deleted

## 2011-01-14 ENCOUNTER — Encounter (HOSPITAL_COMMUNITY): Payer: Self-pay | Admitting: *Deleted

## 2011-01-14 ENCOUNTER — Other Ambulatory Visit: Payer: Self-pay | Admitting: Obstetrics and Gynecology

## 2011-01-14 NOTE — Telephone Encounter (Signed)
Preadmission screen  

## 2011-01-18 NOTE — L&D Delivery Note (Signed)
Delivery Note At 2:57 PM a viable female was delivered via LOA, easy delivery of the head and shoulders, baby placed on pt abd, cord doubly clamped and cut by FOB.  APGAR: 8, 9; weight 7 lb 10.8 oz (3481 g).   Placenta status: schultze 3 vessel spontaneous intact. EBL 300 ml. Cord blood banking by hospital. Dr Pennie Rushing attended delivery. Plans BTL.  Anesthesia:  Epidural Episiotomy: na Lacerations: an Suture Repair: na Est. Blood Loss (mL): 300  Mom to postpartum.  Baby to with mother.  Ana Gray 01/21/2011, 3:21 PM

## 2011-01-21 ENCOUNTER — Inpatient Hospital Stay (HOSPITAL_COMMUNITY)
Admission: RE | Admit: 2011-01-21 | Discharge: 2011-01-23 | DRG: 374 | Disposition: A | Discharge status: Home / Self Care | Payer: BC Managed Care – PPO | Source: Ambulatory Visit | Attending: Obstetrics and Gynecology | Admitting: Obstetrics and Gynecology

## 2011-01-21 ENCOUNTER — Inpatient Hospital Stay (HOSPITAL_COMMUNITY): Admission: RE | Admit: 2011-01-21 | Payer: BC Managed Care – PPO | Source: Ambulatory Visit

## 2011-01-21 ENCOUNTER — Inpatient Hospital Stay (HOSPITAL_COMMUNITY): Payer: BC Managed Care – PPO | Admitting: Anesthesiology

## 2011-01-21 ENCOUNTER — Encounter (HOSPITAL_COMMUNITY): Payer: Self-pay

## 2011-01-21 ENCOUNTER — Encounter (HOSPITAL_COMMUNITY): Payer: Self-pay | Admitting: Anesthesiology

## 2011-01-21 DIAGNOSIS — Z22330 Carrier of Group B streptococcus: Secondary | ICD-10-CM

## 2011-01-21 DIAGNOSIS — O99892 Other specified diseases and conditions complicating childbirth: Principal | ICD-10-CM | POA: Diagnosis present

## 2011-01-21 DIAGNOSIS — Z3009 Encounter for other general counseling and advice on contraception: Secondary | ICD-10-CM

## 2011-01-21 DIAGNOSIS — Z302 Encounter for sterilization: Secondary | ICD-10-CM

## 2011-01-21 DIAGNOSIS — O09529 Supervision of elderly multigravida, unspecified trimester: Secondary | ICD-10-CM | POA: Diagnosis present

## 2011-01-21 DIAGNOSIS — Z3490 Encounter for supervision of normal pregnancy, unspecified, unspecified trimester: Secondary | ICD-10-CM

## 2011-01-21 LAB — CBC
HCT: 34.4 % — ABNORMAL LOW (ref 36.0–46.0)
Hemoglobin: 11.7 g/dL — ABNORMAL LOW (ref 12.0–15.0)
MCH: 30.9 pg (ref 26.0–34.0)
MCHC: 34 g/dL (ref 30.0–36.0)
MCV: 90.8 fL (ref 78.0–100.0)
RBC: 3.79 MIL/uL — ABNORMAL LOW (ref 3.87–5.11)

## 2011-01-21 LAB — RPR: RPR Ser Ql: NONREACTIVE

## 2011-01-21 MED ORDER — LANOLIN HYDROUS EX OINT: TOPICAL_OINTMENT | CUTANEOUS | Status: DC | PRN

## 2011-01-21 MED ORDER — BENZOCAINE-MENTHOL 20-0.5 % EX AERO
INHALATION_SPRAY | CUTANEOUS | Status: AC
  Administered 2011-01-21: 1 via TOPICAL
  Filled 2011-01-21: qty 56

## 2011-01-21 MED ORDER — PRENATAL MULTIVITAMIN CH
1.0000 | ORAL_TABLET | Freq: Every day | ORAL | Status: DC
  Administered 2011-01-22: 1 via ORAL
  Filled 2011-01-21: qty 1

## 2011-01-21 MED ORDER — OXYTOCIN 20 UNITS IN LACTATED RINGERS INFUSION - SIMPLE
125.0000 mL/h | Freq: Once | INTRAVENOUS | Status: AC
  Administered 2011-01-21: 125 mL/h via INTRAVENOUS

## 2011-01-21 MED ORDER — TETANUS-DIPHTH-ACELL PERTUSSIS 5-2.5-18.5 LF-MCG/0.5 IM SUSP: 0.5000 mL | Freq: Once | INTRAMUSCULAR | Status: DC

## 2011-01-21 MED ORDER — LIDOCAINE HCL (PF) 1 % IJ SOLN: 30.0000 mL | INTRAMUSCULAR | Status: DC | PRN

## 2011-01-21 MED ORDER — BENZOCAINE-MENTHOL 20-0.5 % EX AERO
1.0000 "application " | INHALATION_SPRAY | CUTANEOUS | Status: DC | PRN
  Administered 2011-01-21: 1 via TOPICAL

## 2011-01-21 MED ORDER — OXYTOCIN BOLUS FROM INFUSION
500.0000 mL | Freq: Once | INTRAVENOUS | Status: DC
  Filled 2011-01-21: qty 500

## 2011-01-21 MED ORDER — SENNOSIDES-DOCUSATE SODIUM 8.6-50 MG PO TABS
2.0000 | ORAL_TABLET | Freq: Every day | ORAL | Status: DC
  Administered 2011-01-21 – 2011-01-22 (×2): 2 via ORAL

## 2011-01-21 MED ORDER — PHENYLEPHRINE 40 MCG/ML (10ML) SYRINGE FOR IV PUSH (FOR BLOOD PRESSURE SUPPORT): 80.0000 ug | PREFILLED_SYRINGE | INTRAVENOUS | Status: DC | PRN

## 2011-01-21 MED ORDER — DIPHENHYDRAMINE HCL 25 MG PO CAPS
25.0000 mg | ORAL_CAPSULE | Freq: Four times a day (QID) | ORAL | Status: DC | PRN
Start: 2011-01-21 — End: 2011-01-23

## 2011-01-21 MED ORDER — FENTANYL 2.5 MCG/ML BUPIVACAINE 1/10 % EPIDURAL INFUSION (WH - ANES)
14.0000 mL/h | INTRAMUSCULAR | Status: DC
  Administered 2011-01-21 (×2): 14 mL/h via EPIDURAL
  Filled 2011-01-21 (×2): qty 60

## 2011-01-21 MED ORDER — DEXTROSE 5 % IV SOLN
2.5000 10*6.[IU] | INTRAVENOUS | Status: DC
  Administered 2011-01-21: 2.5 10*6.[IU] via INTRAVENOUS
  Filled 2011-01-21 (×5): qty 2.5

## 2011-01-21 MED ORDER — ONDANSETRON HCL 4 MG PO TABS: 4.0000 mg | ORAL_TABLET | ORAL | Status: DC | PRN

## 2011-01-21 MED ORDER — ONDANSETRON HCL 4 MG/2ML IJ SOLN: 4.0000 mg | Freq: Four times a day (QID) | INTRAMUSCULAR | Status: DC | PRN

## 2011-01-21 MED ORDER — ONDANSETRON HCL 4 MG/2ML IJ SOLN: 4.0000 mg | INTRAMUSCULAR | Status: DC | PRN

## 2011-01-21 MED ORDER — FLEET ENEMA 7-19 GM/118ML RE ENEM: 1.0000 | ENEMA | RECTAL | Status: DC | PRN

## 2011-01-21 MED ORDER — PHENYLEPHRINE 40 MCG/ML (10ML) SYRINGE FOR IV PUSH (FOR BLOOD PRESSURE SUPPORT)
80.0000 ug | PREFILLED_SYRINGE | INTRAVENOUS | Status: DC | PRN
  Filled 2011-01-21: qty 5

## 2011-01-21 MED ORDER — EPHEDRINE 5 MG/ML INJ
10.0000 mg | INTRAVENOUS | Status: DC | PRN
  Filled 2011-01-21: qty 4

## 2011-01-21 MED ORDER — EPHEDRINE 5 MG/ML INJ: 10.0000 mg | INTRAVENOUS | Status: DC | PRN

## 2011-01-21 MED ORDER — DIBUCAINE 1 % RE OINT: 1.0000 "application " | TOPICAL_OINTMENT | RECTAL | Status: DC | PRN

## 2011-01-21 MED ORDER — CITRIC ACID-SODIUM CITRATE 334-500 MG/5ML PO SOLN: 30.0000 mL | ORAL | Status: DC | PRN

## 2011-01-21 MED ORDER — DIPHENHYDRAMINE HCL 50 MG/ML IJ SOLN: 12.5000 mg | INTRAMUSCULAR | Status: DC | PRN

## 2011-01-21 MED ORDER — OXYTOCIN 20 UNITS IN LACTATED RINGERS INFUSION - SIMPLE
1.0000 m[IU]/min | INTRAVENOUS | Status: DC
  Administered 2011-01-21: 2 m[IU]/min via INTRAVENOUS

## 2011-01-21 MED ORDER — LACTATED RINGERS IV SOLN
INTRAVENOUS | Status: DC
  Administered 2011-01-21 (×2): via INTRAVENOUS

## 2011-01-21 MED ORDER — OXYTOCIN 20 UNITS IN LACTATED RINGERS INFUSION - SIMPLE
INTRAVENOUS | Status: AC
  Filled 2011-01-21: qty 1000

## 2011-01-21 MED ORDER — SODIUM CHLORIDE 0.9 % IV SOLN: 250.0000 mL | INTRAVENOUS | Status: DC | PRN

## 2011-01-21 MED ORDER — OXYCODONE-ACETAMINOPHEN 5-325 MG PO TABS
1.0000 | ORAL_TABLET | ORAL | Status: DC | PRN
  Administered 2011-01-22 (×2): 1 via ORAL
  Administered 2011-01-23 (×2): 2 via ORAL
  Filled 2011-01-21: qty 1
  Filled 2011-01-21: qty 2
  Filled 2011-01-21: qty 1
  Filled 2011-01-21: qty 2

## 2011-01-21 MED ORDER — TERBUTALINE SULFATE 1 MG/ML IJ SOLN: 0.2500 mg | Freq: Once | INTRAMUSCULAR | Status: DC | PRN

## 2011-01-21 MED ORDER — IBUPROFEN 600 MG PO TABS
600.0000 mg | ORAL_TABLET | Freq: Four times a day (QID) | ORAL | Status: DC
  Administered 2011-01-21 – 2011-01-23 (×5): 600 mg via ORAL
  Filled 2011-01-21 (×5): qty 1

## 2011-01-21 MED ORDER — WITCH HAZEL-GLYCERIN EX PADS: 1.0000 "application " | MEDICATED_PAD | CUTANEOUS | Status: DC | PRN

## 2011-01-21 MED ORDER — LACTATED RINGERS IV SOLN: 500.0000 mL | INTRAVENOUS | Status: DC | PRN

## 2011-01-21 MED ORDER — SIMETHICONE 80 MG PO CHEW
80.0000 mg | CHEWABLE_TABLET | ORAL | Status: DC | PRN
  Administered 2011-01-22: 80 mg via ORAL

## 2011-01-21 MED ORDER — OXYCODONE-ACETAMINOPHEN 5-325 MG PO TABS: 2.0000 | ORAL_TABLET | ORAL | Status: DC | PRN

## 2011-01-21 MED ORDER — PENICILLIN G POTASSIUM 5000000 UNITS IJ SOLR
5.0000 10*6.[IU] | Freq: Once | INTRAVENOUS | Status: AC
  Administered 2011-01-21: 5 10*6.[IU] via INTRAVENOUS
  Filled 2011-01-21: qty 5

## 2011-01-21 MED ORDER — IBUPROFEN 600 MG PO TABS
600.0000 mg | ORAL_TABLET | Freq: Four times a day (QID) | ORAL | Status: DC | PRN
  Administered 2011-01-21: 600 mg via ORAL
  Filled 2011-01-21: qty 1

## 2011-01-21 MED ORDER — ZOLPIDEM TARTRATE 5 MG PO TABS: 5.0000 mg | ORAL_TABLET | Freq: Every evening | ORAL | Status: DC | PRN

## 2011-01-21 MED ORDER — SODIUM CHLORIDE 0.9 % IJ SOLN: 3.0000 mL | INTRAMUSCULAR | Status: DC | PRN

## 2011-01-21 MED ORDER — ACETAMINOPHEN 325 MG PO TABS: 650.0000 mg | ORAL_TABLET | ORAL | Status: DC | PRN

## 2011-01-21 MED ORDER — SODIUM CHLORIDE 0.9 % IJ SOLN: 3.0000 mL | Freq: Two times a day (BID) | INTRAMUSCULAR | Status: DC

## 2011-01-21 MED ORDER — LIDOCAINE HCL 1.5 % IJ SOLN
INTRAMUSCULAR | Status: DC | PRN
  Administered 2011-01-21: 5 mL via INTRADERMAL
  Administered 2011-01-21: 2 mL via INTRADERMAL
  Administered 2011-01-21: 5 mL via INTRADERMAL

## 2011-01-21 MED ORDER — LACTATED RINGERS IV SOLN
500.0000 mL | Freq: Once | INTRAVENOUS | Status: AC
  Administered 2011-01-21: 1000 mL via INTRAVENOUS

## 2011-01-21 MED ORDER — BUTORPHANOL TARTRATE 2 MG/ML IJ SOLN: 2.0000 mg | INTRAMUSCULAR | Status: DC | PRN

## 2011-01-21 NOTE — Progress Notes (Signed)
Comfortable, agrees to arm O Fhts 130-140s LTV mod accels     uc q 4-6 mild to mod     Vag 4 90-1 ARM mod amount clear fluid A active labor P continue care, plans epidural Ana Gray, CNM

## 2011-01-21 NOTE — H&P (Signed)
Ana Gray is a 35 y.o. female presenting for elective induction, S<D with [place] during pg resolved with 7-6  At 36 week [place]. Denies srom or vag bleeding, with+ FM, agrees to IV pitocin and ARM. Pg significant for: S<D resolved GBS+ Hx abd pap had colpo 2011 Varicosities Transfer of care at 28 weeks. AMA  Meds: PNV, no inhaler use History OB History    Grav Para Term Preterm Abortions TAB SAB Ect Mult Living   4 2 2  0 1 0 1 0 0 2     Past Medical History  Diagnosis Date  . Edema   . Breast mass in female     benign  . Asthma   . ADHD (attention deficit hyperactivity disorder)   . Anemia   . Obese   . Varicosities   . Headache   . AMA (advanced maternal age) multigravida 35+    Past Surgical History  Procedure Date  . Cholecystectomy   . Closed reduction shoulder dislocation     left  . Colposcopy    Family History: family history includes Cancer in her maternal grandmother; Other in an unspecified family member; and Vision loss in her cousin. Social History:  reports that she has never smoked. She has never used smokeless tobacco. She reports that she does not drink alcohol or use illicit drugs.  ROS  Dilation: 3.5 Effacement (%): 60 Station: -2 Exam by:: Elon Alas, CNM Blood pressure 103/68, pulse 100, temperature 99 F (37.2 C), temperature source Oral, resp. rate 18, height 5\' 5"  (1.651 m), weight 246 lb (111.585 kg), last menstrual period 04/23/2010. Exam Physical Exam  Lungs clear bilaterally on auscultation, AP regular, abd soft, gravid, nt Bowel sounds active, +1 edema lower legs Prenatal labs: ABO, Rh: A/Positive/-- (06/19 0000) Antibody: Negative (06/19 0000) Rubella: Immune (06/19 0000) RPR: Nonreactive (06/19 0000)  HBsAg: Negative (06/19 0000)  HIV: Non-reactive (06/19 0000)  GBS:     Assessment/Plan: 39 week IUP elective induction GBS+ AMA Plan IV Pitocin, Pen G, ARM discussed and pt concurs, collaboration with Dr. Pennie Rushing.  Ana Gray,  Ana Gray 01/21/2011, 8:50 AM

## 2011-01-21 NOTE — Anesthesia Procedure Notes (Signed)
Epidural Patient location during procedure: OB Start time: 01/21/2011 11:14 AM Reason for block: procedure for pain  Staffing Performed by: anesthesiologist   Preanesthetic Checklist Completed: patient identified, site marked, surgical consent, pre-op evaluation, timeout performed, IV checked, risks and benefits discussed and monitors and equipment checked  Epidural Patient position: sitting Prep: site prepped and draped and DuraPrep Patient monitoring: continuous pulse ox and blood pressure Approach: midline Injection technique: LOR air  Needle:  Needle type: Tuohy  Needle gauge: 17 G Needle length: 9 cm Needle insertion depth: 6 cm Catheter type: closed end flexible Catheter size: 19 Gauge Catheter at skin depth: 11 cm Test dose: negative  Assessment Events: blood not aspirated, injection not painful, no injection resistance, negative IV test and no paresthesia  Additional Notes Discussed risk of headache, infection, bleeding, nerve injury and failed or incomplete block.  Patient voices understanding and wishes to proceed.

## 2011-01-21 NOTE — Anesthesia Preprocedure Evaluation (Signed)
Anesthesia Evaluation  Patient identified by MRN, date of birth, ID band Patient awake    Reviewed: Allergy & Precautions, H&P , NPO status , Patient's Chart, lab work & pertinent test results, reviewed documented beta blocker date and time   History of Anesthesia Complications Negative for: history of anesthetic complications  Airway Mallampati: I TM Distance: >3 FB Neck ROM: full    Dental  (+) Teeth Intact   Pulmonary asthma (last inhaler use years ago) ,  clear to auscultation        Cardiovascular neg cardio ROS regular Normal    Neuro/Psych Negative Neurological ROS  Negative Psych ROS   GI/Hepatic negative GI ROS, Neg liver ROS,   Endo/Other  Morbid obesity  Renal/GU negative Renal ROS     Musculoskeletal   Abdominal   Peds  Hematology negative hematology ROS (+)   Anesthesia Other Findings   Reproductive/Obstetrics (+) Pregnancy                           Anesthesia Physical Anesthesia Plan  ASA: I  Anesthesia Plan: Epidural   Post-op Pain Management:    Induction:   Airway Management Planned:   Additional Equipment:   Intra-op Plan:   Post-operative Plan:   Informed Consent: I have reviewed the patients History and Physical, chart, labs and discussed the procedure including the risks, benefits and alternatives for the proposed anesthesia with the patient or authorized representative who has indicated his/her understanding and acceptance.     Plan Discussed with:   Anesthesia Plan Comments:         Anesthesia Quick Evaluation

## 2011-01-21 NOTE — Progress Notes (Signed)
Patient is now s/p NSVD as outlined in delivery note.  She wants permanent sterilization by tubal ligation.  She desires no further children.  The indications, risks and benefits of postpartum sterilization were reviewed.  She voiced understanding of the risks of anesthesia, bleeding, infection and damage to adjacent organs, and wishes to proceed.  Her surgery is scheduled for 9am on 01-22-11.  She will be NPO after midnight in preparation.

## 2011-01-21 NOTE — Progress Notes (Signed)
Notified Dr Pennie Rushing of FHr tracing, UCs, SVE, and AROM. No roders given at this time.

## 2011-01-21 NOTE — Progress Notes (Signed)
Delivery of live viable female by Franchot Erichsen, CNM

## 2011-01-21 NOTE — Progress Notes (Signed)
Comfortable with epidural O VSS      Fhts 140 LTV mod      uc q 2-3 mod      Vag 7 -2 by RN A active labor P continue care. Lavera Guise, CNM

## 2011-01-22 ENCOUNTER — Encounter (HOSPITAL_COMMUNITY): Payer: Self-pay | Admitting: Registered Nurse

## 2011-01-22 ENCOUNTER — Inpatient Hospital Stay (HOSPITAL_COMMUNITY): Payer: BC Managed Care – PPO | Admitting: Registered Nurse

## 2011-01-22 ENCOUNTER — Other Ambulatory Visit: Payer: Self-pay | Admitting: Obstetrics and Gynecology

## 2011-01-22 ENCOUNTER — Encounter (HOSPITAL_COMMUNITY)
Admission: RE | Disposition: A | Discharge status: Home / Self Care | Payer: Self-pay | Source: Ambulatory Visit | Attending: Obstetrics and Gynecology

## 2011-01-22 DIAGNOSIS — Z3009 Encounter for other general counseling and advice on contraception: Secondary | ICD-10-CM

## 2011-01-22 HISTORY — PX: TUBAL LIGATION: SHX77

## 2011-01-22 LAB — CBC
HCT: 35.1 % — ABNORMAL LOW (ref 36.0–46.0)
MCV: 90.9 fL (ref 78.0–100.0)
RBC: 3.86 MIL/uL — ABNORMAL LOW (ref 3.87–5.11)
WBC: 14.5 10*3/uL — ABNORMAL HIGH (ref 4.0–10.5)

## 2011-01-22 SURGERY — LIGATION, FALLOPIAN TUBE, POSTPARTUM
Anesthesia: Epidural | Site: Abdomen | Laterality: Bilateral | Wound class: Clean Contaminated

## 2011-01-22 MED ORDER — FAMOTIDINE 20 MG PO TABS: 40.0000 mg | ORAL_TABLET | Freq: Once | ORAL | Status: DC

## 2011-01-22 MED ORDER — LIDOCAINE HCL (CARDIAC) 20 MG/ML IV SOLN
INTRAVENOUS | Status: AC
  Filled 2011-01-22: qty 5

## 2011-01-22 MED ORDER — SODIUM BICARBONATE 8.4 % IV SOLN
INTRAVENOUS | Status: AC
  Filled 2011-01-22: qty 50

## 2011-01-22 MED ORDER — LACTATED RINGERS IV SOLN
INTRAVENOUS | Status: DC | PRN
  Administered 2011-01-22 (×2): via INTRAVENOUS

## 2011-01-22 MED ORDER — SODIUM CHLORIDE 0.9 % IR SOLN
Status: DC | PRN
  Administered 2011-01-22: 1000 mL

## 2011-01-22 MED ORDER — KETOROLAC TROMETHAMINE 30 MG/ML IJ SOLN
INTRAMUSCULAR | Status: DC | PRN
Start: 2011-01-22 — End: 2011-01-22
  Administered 2011-01-22: 30 mg via INTRAVENOUS

## 2011-01-22 MED ORDER — ONDANSETRON HCL 4 MG/2ML IJ SOLN
INTRAMUSCULAR | Status: DC | PRN
  Administered 2011-01-22: 4 mg via INTRAVENOUS

## 2011-01-22 MED ORDER — KETOROLAC TROMETHAMINE 30 MG/ML IJ SOLN
INTRAMUSCULAR | Status: AC
  Filled 2011-01-22: qty 1

## 2011-01-22 MED ORDER — FENTANYL CITRATE 0.05 MG/ML IJ SOLN
25.0000 ug | INTRAMUSCULAR | Status: DC | PRN
  Administered 2011-01-22 (×2): 50 ug via INTRAVENOUS

## 2011-01-22 MED ORDER — MIDAZOLAM HCL 5 MG/5ML IJ SOLN
INTRAMUSCULAR | Status: DC | PRN
  Administered 2011-01-22 (×2): 1 mg via INTRAVENOUS

## 2011-01-22 MED ORDER — PROPOFOL 10 MG/ML IV EMUL
INTRAVENOUS | Status: AC
  Filled 2011-01-22: qty 20

## 2011-01-22 MED ORDER — FAMOTIDINE 20 MG PO TABS
40.0000 mg | ORAL_TABLET | Freq: Once | ORAL | Status: AC
  Administered 2011-01-22: 40 mg via ORAL
  Filled 2011-01-22: qty 2

## 2011-01-22 MED ORDER — LIDOCAINE-EPINEPHRINE (PF) 2 %-1:200000 IJ SOLN
INTRAMUSCULAR | Status: AC
  Filled 2011-01-22: qty 20

## 2011-01-22 MED ORDER — ONDANSETRON HCL 4 MG/2ML IJ SOLN
INTRAMUSCULAR | Status: AC
  Filled 2011-01-22: qty 2

## 2011-01-22 MED ORDER — LACTATED RINGERS IV SOLN: INTRAVENOUS | Status: DC

## 2011-01-22 MED ORDER — MIDAZOLAM HCL 2 MG/2ML IJ SOLN
INTRAMUSCULAR | Status: AC
  Filled 2011-01-22: qty 2

## 2011-01-22 MED ORDER — KETOROLAC TROMETHAMINE 30 MG/ML IJ SOLN: 15.0000 mg | Freq: Once | INTRAMUSCULAR | Status: AC | PRN

## 2011-01-22 MED ORDER — METOCLOPRAMIDE HCL 10 MG PO TABS
10.0000 mg | ORAL_TABLET | Freq: Once | ORAL | Status: AC
  Administered 2011-01-22: 10 mg via ORAL
  Filled 2011-01-22: qty 1

## 2011-01-22 MED ORDER — LIDOCAINE HCL (CARDIAC) 20 MG/ML IV SOLN
INTRAVENOUS | Status: DC | PRN
  Administered 2011-01-22: 40 mg via INTRAVENOUS

## 2011-01-22 MED ORDER — BUPIVACAINE HCL (PF) 0.25 % IJ SOLN
INTRAMUSCULAR | Status: DC | PRN
  Administered 2011-01-22: 10 mL

## 2011-01-22 MED ORDER — METOCLOPRAMIDE HCL 10 MG PO TABS: 10.0000 mg | ORAL_TABLET | Freq: Once | ORAL | Status: DC

## 2011-01-22 MED ORDER — PROPOFOL 10 MG/ML IV EMUL
INTRAVENOUS | Status: DC | PRN
  Administered 2011-01-22 (×3): 20 mg via INTRAVENOUS

## 2011-01-22 MED ORDER — SODIUM BICARBONATE 8.4 % IV SOLN
INTRAVENOUS | Status: DC | PRN
  Administered 2011-01-22: 3 mL via EPIDURAL

## 2011-01-22 MED ORDER — LACTATED RINGERS IV SOLN
INTRAVENOUS | Status: DC
  Administered 2011-01-22: 125 mL/h via INTRAVENOUS

## 2011-01-22 MED ORDER — FENTANYL CITRATE 0.05 MG/ML IJ SOLN
INTRAMUSCULAR | Status: AC
  Administered 2011-01-22: 50 ug via INTRAVENOUS
  Filled 2011-01-22: qty 2

## 2011-01-22 SURGICAL SUPPLY — 24 items
ADH SKN CLS APL DERMABOND .7 (GAUZE/BANDAGES/DRESSINGS) ×1
BLADE HEX COATED 2.75 (ELECTRODE) IMPLANT
CHLORAPREP W/TINT 26ML (MISCELLANEOUS) ×2 IMPLANT
CLOTH BEACON ORANGE TIMEOUT ST (SAFETY) ×2 IMPLANT
CONTAINER PREFILL 10% NBF 15ML (MISCELLANEOUS) ×4 IMPLANT
DERMABOND ADVANCED (GAUZE/BANDAGES/DRESSINGS) ×1
DERMABOND ADVANCED .7 DNX12 (GAUZE/BANDAGES/DRESSINGS) ×1 IMPLANT
ELECT REM PT RETURN 9FT ADLT (ELECTROSURGICAL) ×2
ELECTRODE REM PT RTRN 9FT ADLT (ELECTROSURGICAL) ×1 IMPLANT
GLOVE SURG SS PI 6.5 STRL IVOR (GLOVE) ×4 IMPLANT
GOWN PREVENTION PLUS LG XLONG (DISPOSABLE) ×4 IMPLANT
NEEDLE HYPO 22GX1.5 SAFETY (NEEDLE) ×2 IMPLANT
NS IRRIG 1000ML POUR BTL (IV SOLUTION) ×2 IMPLANT
PACK ABDOMINAL MINOR (CUSTOM PROCEDURE TRAY) ×2 IMPLANT
PENCIL BUTTON HOLSTER BLD 10FT (ELECTRODE) ×2 IMPLANT
SPONGE LAP 4X18 X RAY DECT (DISPOSABLE) ×2 IMPLANT
SUT CHROMIC 2 0 SH (SUTURE) ×2 IMPLANT
SUT VIC AB 0 CT2 27 (SUTURE) ×2 IMPLANT
SUT VIC AB 3-0 X1 27 (SUTURE) IMPLANT
SYR BULB IRRIGATION 50ML (SYRINGE) IMPLANT
SYR CONTROL 10ML LL (SYRINGE) ×2 IMPLANT
TOWEL OR 17X24 6PK STRL BLUE (TOWEL DISPOSABLE) ×4 IMPLANT
TRAY FOLEY CATH 14FR (SET/KITS/TRAYS/PACK) ×2 IMPLANT
WATER STERILE IRR 1000ML POUR (IV SOLUTION) IMPLANT

## 2011-01-22 NOTE — Op Note (Signed)
01/21/2011 - 01/22/2011  9:28 AM  PATIENT:  Ana Gray  36 y.o. female  PRE-OPERATIVE DIAGNOSIS:  desires sterilization  POST-OPERATIVE DIAGNOSIS:  desires sterlization  PROCEDURE:  Procedure(s): POST PARTUM TUBAL LIGATION  SURGEON:  Surgeon(s): Hal Morales, MD  ASSISTANTS: none   ANESTHESIA:   epidural  ESTIMATED BLOOD LOSS: * No blood loss amount entered *   BLOOD ADMINISTERED:none  LOCAL MEDICATIONS USED:  BUPIVICAINE 10CC  COMPLICATIONS:none  FINDINGS:Tubes normal for the postpartum state  SPECIMEN:  Source of Specimen:  Bilateral fallopian tubes  DISPOSITION OF SPECIMEN:  PATHOLOGY  COUNTS:  YES  DESCRIPTION OF PROCEDURE:the patient was taken to the operating room after appropriate identification and placed on the operating table. The abdomen was prepped with multiple layers of chloraprep and draped as a sterile field.Foley was inserted under sterile conditions. Subumbilical injection of 10 cc of quarter percent Marcaine was undertaken. A subumbilical incision was made and the peritoneum entered with a combination of blunt and sharp dissection.  The left fallopian tube was then identified, followed to its fimbriated end, and grasped at the isthmic portion and elevated. A suture of 2-0 chromic was placed through the mesosalpinx and tied fore and aft on the tube. A second ligature was placed proximal to that and the intervening knuckle of tube excised. The cut ends were cauterized. A similar procedure was carried out on the opposite side. Hemostasis was noted to be adequate.  The abdominal peritoneum was closed in pursestring fashion with a suture of 0 Vicryl. The fascia was closed with a suture of 0 Vicryl in a running fashion. The subumbilical incision was closed with a subcuticular suture of 3-0 Monocryl. A sterile dressing was applied and the patient taken from the operating room to the recovery room in satisfactory condition having tolerated the procedure well  the sponge and instrument counts correct.  PLAN OF CARE: to MBU  PATIENT DISPOSITION:  PACU - hemodynamically stable.   Delay start of Pharmacological VTE agent (>24hrs) due to surgical blood loss or risk of bleeding:  not applicablePt wore SCD hose throughout procedure and post op.

## 2011-01-22 NOTE — Transfer of Care (Signed)
Immediate Anesthesia Transfer of Care Note  Patient: Ana Gray  Procedure(s) Performed:  POST PARTUM TUBAL LIGATION - post partum tubal ligation bilateral  Patient Location: PACU  Anesthesia Type: Epidural  Level of Consciousness: awake, alert , oriented and patient cooperative  Airway & Oxygen Therapy: Patient Spontanous Breathing and Patient connected to nasal cannula oxygen  Post-op Assessment: Report given to PACU RN  Post vital signs: Reviewed  Complications: No apparent anesthesia complications

## 2011-01-22 NOTE — Addendum Note (Signed)
Addendum  created 01/22/11 1801 by Len Blalock   Modules edited:Notes Section

## 2011-01-22 NOTE — Anesthesia Postprocedure Evaluation (Signed)
  Anesthesia Post-op Note  Patient: Ana Gray  Procedure(s) Performed:  POST PARTUM TUBAL LIGATION - post partum tubal ligation bilateral  Patient Location: PACU and Mother/Baby  Anesthesia Type: Epidural  Level of Consciousness: awake, alert  and oriented  Airway and Oxygen Therapy: Patient Spontanous Breathing   Post-op Assessment: Patient's Cardiovascular Status Stable and Respiratory Function Stable  Post-op Vital Signs: stable  Complications: No apparent anesthesia complications

## 2011-01-22 NOTE — Progress Notes (Signed)
Post Partum Day 1 Subjective: no complaints, voiding and desires sterilization  Objective: Blood pressure 94/57, pulse 75, temperature 97.6 F (36.4 C), temperature source Oral, resp. rate 16, height 5\' 5"  (1.651 m), weight 246 lb (111.585 kg), last menstrual period 04/23/2010, SpO2 95.00%, unknown if currently breastfeeding.  Physical Exam:  General: alert, cooperative, no distress and moderately obese Lochia: appropriate Uterine Fundus: firm Incision: na DVT Evaluation: No evidence of DVT seen on physical exam.   Basename 01/22/11 0523 01/21/11 0719  HGB 11.9* 11.7*  HCT 35.1* 34.4*    Assessment/Plan: Desires sterilization See note from 01/21/11 re surgery discussion  Plan:   Pt examined and wants to proceed with postpatum tubal sterilization   LOS: 1 day   Finnick Orosz P 01/22/2011, 9:23 AM

## 2011-01-22 NOTE — Anesthesia Preprocedure Evaluation (Addendum)
Anesthesia Evaluation  Patient identified by MRN, date of birth, ID band Patient awake    Reviewed: Allergy & Precautions, H&P , NPO status , Patient's Chart, lab work & pertinent test results, reviewed documented beta blocker date and time   History of Anesthesia Complications Negative for: history of anesthetic complications  Airway Mallampati: I TM Distance: >3 FB Neck ROM: full    Dental  (+) Teeth Intact   Pulmonary asthma (last inhaler use years ago) ,  clear to auscultation        Cardiovascular neg cardio ROS regular Normal    Neuro/Psych Negative Neurological ROS  Negative Psych ROS   GI/Hepatic negative GI ROS, Neg liver ROS,   Endo/Other  Morbid obesity  Renal/GU negative Renal ROS     Musculoskeletal   Abdominal   Peds  Hematology negative hematology ROS (+)   Anesthesia Other Findings   Reproductive/Obstetrics                           Anesthesia Physical  Anesthesia Plan  ASA: II  Anesthesia Plan: Epidural   Post-op Pain Management:    Induction:   Airway Management Planned:   Additional Equipment:   Intra-op Plan:   Post-operative Plan:   Informed Consent: I have reviewed the patients History and Physical, chart, labs and discussed the procedure including the risks, benefits and alternatives for the proposed anesthesia with the patient or authorized representative who has indicated his/her understanding and acceptance.   Dental Advisory Given  Plan Discussed with: CRNA and Surgeon  Anesthesia Plan Comments:        Anesthesia Quick Evaluation

## 2011-01-22 NOTE — Anesthesia Postprocedure Evaluation (Signed)
Anesthesia Post Note  Patient: Ana Gray  Procedure(s) Performed:  POST PARTUM TUBAL LIGATION - post partum tubal ligation bilateral  Anesthesia type: Epidural  Patient location: PACU  Post pain: Pain level controlled  Post assessment: Post-op Vital signs reviewed  Last Vitals:  Filed Vitals:   01/22/11 1100  BP: 117/77  Pulse: 75  Temp: 36.6 C  Resp: 16    Post vital signs: stable  Level of consciousness: awake  Complications: No apparent anesthesia complications

## 2011-01-22 NOTE — OR Nursing (Signed)
Patient taken to OR suite so epidural dosing could be started per Dr. Mervyn Skeeters. Cassidy's order.

## 2011-01-22 NOTE — Preoperative (Signed)
Beta Blockers   Reason not to administer Beta Blockers:Not Applicable 

## 2011-01-23 MED ORDER — OXYCODONE-ACETAMINOPHEN 5-325 MG PO TABS: 1.0000 | ORAL_TABLET | ORAL | Status: AC | PRN

## 2011-01-23 MED ORDER — IBUPROFEN 600 MG PO TABS: 600.0000 mg | ORAL_TABLET | Freq: Four times a day (QID) | ORAL | Status: AC | PRN

## 2011-01-23 NOTE — Discharge Summary (Signed)
Physician Discharge Summary  Patient ID: Ana Gray MRN: [id number] DOB/AGE: November 23, 1975 36 y.o.  Admit date: 01/21/2011 Discharge date: 01/23/2011  Admission Diagnoses: term pg induction                                         AMA                                         GBS+  Discharge Diagnoses: post SVD, BTL Active Problems:  AMA (advanced maternal age) multigravida 38+   Discharged Condition: stable  Hospital Course: induction at term, ARM, IV pitocin, Epidural, SVD, BTL, normal involution, discharged with moist soaks to incision  Consults: none  Significant Diagnostic Studies: labs:   Treatments: surgery: BTL S: comfortable with pain meds, little bleeding breastfeeding well, look I have a "pouch of tissue" Discharge Exam: Blood pressure 112/74, pulse 68, temperature 98.3 F (36.8 C), temperature source Oral, resp. rate 18, height 5\' 5"  (1.651 m), weight 246 lb (111.585 kg), last menstrual period 04/23/2010, SpO2 96.00%, unknown if currently breastfeeding. General appearance: no distress bowel sounds hypoactive, abd softly distended, with standing "pouch of tissue two inches to R of incision 2 -8 cm size, with lying down this is not visible, not tender, incision at U with no drainage or edema but with 3 cm pink none tender not warm to touch FF at U small serosa flow - homan's sign bilaterally trace edema lower legs.  Disposition: Home or Self Care  Discharge Orders    Future Orders Please Complete By Expires   Discharge instructions      Comments:   F/o office 6 weeks   Strep B DNA probe      Comments:   This external order was created through the Results Console.     Medication List  As of 01/23/2011 10:54 AM   START taking these medications         ibuprofen 600 MG tablet   Commonly known as: ADVIL,MOTRIN   Take 1 tablet (600 mg total) by mouth every 6 (six) hours as needed for pain.         CONTINUE taking these medications         acetaminophen 500 MG  tablet   Commonly known as: TYLENOL      prenatal vitamin w/FE, FA 27-1 MG Tabs          Where to get your medications    These are the prescriptions that you need to pick up.   You may get these medications from any pharmacy.         ibuprofen 600 MG tablet           Follow-up Information    Follow up with [facility] in 6 weeks.       Plan moist soaks to incision BID x 20 min x 3 days s/s infection fever to report, collaboration with Dr. Estanislado Pandy.  SignedLavera Guise 01/23/2011, 10:54 AM

## 2011-01-24 ENCOUNTER — Encounter (HOSPITAL_COMMUNITY): Payer: Self-pay | Admitting: Obstetrics and Gynecology

## 2011-01-24 NOTE — Anesthesia Postprocedure Evaluation (Signed)
Patient stable following vaginal delivery.  

## 2011-02-08 ENCOUNTER — Inpatient Hospital Stay (HOSPITAL_COMMUNITY)
Admission: AD | Admit: 2011-02-08 | Discharge: 2011-02-08 | Disposition: A | Discharge status: Home / Self Care | Payer: BC Managed Care – PPO | Source: Ambulatory Visit | Attending: Obstetrics and Gynecology | Admitting: Obstetrics and Gynecology

## 2011-02-08 ENCOUNTER — Encounter (HOSPITAL_COMMUNITY): Payer: Self-pay | Admitting: *Deleted

## 2011-02-08 DIAGNOSIS — O99893 Other specified diseases and conditions complicating puerperium: Secondary | ICD-10-CM | POA: Insufficient documentation

## 2011-02-08 DIAGNOSIS — R030 Elevated blood-pressure reading, without diagnosis of hypertension: Secondary | ICD-10-CM | POA: Insufficient documentation

## 2011-02-08 LAB — CBC
HCT: 39.1 % (ref 36.0–46.0)
Hemoglobin: 13 g/dL (ref 12.0–15.0)
MCHC: 33.2 g/dL (ref 30.0–36.0)
MCV: 90.3 fL (ref 78.0–100.0)
RDW: 13.7 % (ref 11.5–15.5)

## 2011-02-08 LAB — COMPREHENSIVE METABOLIC PANEL
Alkaline Phosphatase: 95 U/L (ref 39–117)
BUN: 14 mg/dL (ref 6–23)
Creatinine, Ser: 0.75 mg/dL (ref 0.50–1.10)
GFR calc Af Amer: >90 mL/min (ref >90)
Glucose, Bld: 86 mg/dL (ref 70–99)
Potassium: 3.7 mEq/L (ref 3.5–5.1)
Total Bilirubin: 0.3 mg/dL (ref 0.3–1.2)
Total Protein: 6.9 g/dL (ref 6.0–8.3)

## 2011-02-08 LAB — LACTATE DEHYDROGENASE: LDH: 189 U/L (ref 94–250)

## 2011-02-08 LAB — URINALYSIS, ROUTINE W REFLEX MICROSCOPIC
Ketones, ur: NEGATIVE mg/dL
Leukocytes, UA: NEGATIVE
Nitrite: NEGATIVE
Protein, ur: NEGATIVE mg/dL

## 2011-02-08 MED ORDER — ACETAMINOPHEN 500 MG PO TABS
1000.0000 mg | ORAL_TABLET | Freq: Once | ORAL | Status: AC
  Administered 2011-02-08: 1000 mg via ORAL
  Filled 2011-02-08: qty 2

## 2011-02-08 MED ORDER — LABETALOL HCL 100 MG PO TABS
200.0000 mg | ORAL_TABLET | Freq: Once | ORAL | Status: AC
  Administered 2011-02-08: 200 mg via ORAL
  Filled 2011-02-08 (×2): qty 2

## 2011-02-08 MED ORDER — IBUPROFEN 800 MG PO TABS
800.0000 mg | ORAL_TABLET | Freq: Once | ORAL | Status: AC
  Administered 2011-02-08: 800 mg via ORAL
  Filled 2011-02-08: qty 1

## 2011-02-08 MED ORDER — LABETALOL HCL 200 MG PO TABS: 200.0000 mg | ORAL_TABLET | Freq: Two times a day (BID) | ORAL | Status: DC

## 2011-02-08 NOTE — ED Notes (Signed)
Regular diet given

## 2011-02-08 NOTE — Progress Notes (Signed)
Pt states, " I delivered on Jan 21, 2011 ( vaginally) ibuprofen have had headaches for the past week and thought that I was tired from being up at night. My home care nurse checked my BP and it was 3146/96 today, so my doctor sent me in."

## 2011-02-08 NOTE — ED Notes (Signed)
Almond Lint CNM in to see pt.

## 2011-02-08 NOTE — ED Provider Notes (Signed)
History     Chief Complaint  Patient presents with  . Hypertension  . Headache   HPI Comments: Pt is a 36yo G4P3 s/p SVD with BTL  on 1/4 without any complications. She had a BP check at home today and was 148/96, 144/94, so pt was told to come to MAU. She c/o headache over the last few days that has not been relieved with motrin. She denies any visual disturbances, no N/V/epigastric pain. She has no hx of elevated BP's. Or pre-eclampsia.    Hypertension Associated symptoms include headaches.  Headache  Her past medical history is significant for hypertension.     Past Medical History  Diagnosis Date  . Edema   . Breast mass in female     benign  . Asthma   . ADHD (attention deficit hyperactivity disorder)   . Anemia   . Obese   . Varicosities   . Headache   . AMA (advanced maternal age) multigravida 35+     Past Surgical History  Procedure Date  . Cholecystectomy   . Closed reduction shoulder dislocation     left  . Colposcopy   . Tubal ligation 01/22/2011    Procedure: POST PARTUM TUBAL LIGATION;  Surgeon: Hal Morales, MD;  Location: [facility];  Service: Gynecology;  Laterality: Bilateral;  post partum tubal ligation bilateral    Family History  Problem Relation Age of Onset  . Other      non contributory  . Cancer Maternal Grandmother     throat  . Vision loss Cousin     History  Substance Use Topics  . Smoking status: Never Smoker   . Smokeless tobacco: Never Used  . Alcohol Use: No    Allergies: No Known Allergies  Prescriptions prior to admission  Medication Sig Dispense Refill  . ibuprofen (ADVIL,MOTRIN) 600 MG tablet Take 600 mg by mouth every 6 (six) hours as needed. Takes for pain      . Prenatal Vit-Fe Fumarate-FA (PRENATAL MULTIVITAMIN) TABS Take 1 tablet by mouth daily.        Review of Systems  Neurological: Positive for headaches.  All other systems reviewed and are negative.   Physical Exam   Blood pressure 138/85, pulse 65,  resp. rate 18, SpO2 97.00%, not currently breastfeeding.  Physical Exam  Nursing note and vitals reviewed. Constitutional: She is oriented to person, place, and time. She appears well-developed and well-nourished.  HENT:  Head: Normocephalic.  Neck: Normal range of motion.  Cardiovascular: Normal rate and normal heart sounds.   Respiratory: Effort normal and breath sounds normal.  GI: Soft. Bowel sounds are normal. She exhibits no distension. There is no tenderness.  Genitourinary:       Deferred   Musculoskeletal: Normal range of motion. She exhibits no edema and no tenderness.  Neurological: She is alert and oriented to person, place, and time. She has normal reflexes.       Neg clonus  Skin: Skin is warm and dry.  Psychiatric: She has a normal mood and affect. Her behavior is normal.    Results for orders placed during the hospital encounter of 02/08/11 (from the past 24 hour(s))  CBC     Status: Normal   Collection Time   02/08/11  4:23 PM      Component Value Range   WBC 8.5  4.0 - 10.5 (K/uL)   RBC 4.33  3.87 - 5.11 (MIL/uL)   Hemoglobin 13.0  12.0 - 15.0 (g/dL)  HCT 39.1  36.0 - 46.0 (%)   MCV 90.3  78.0 - 100.0 (fL)   MCH 30.0  26.0 - 34.0 (pg)   MCHC 33.2  30.0 - 36.0 (g/dL)   RDW 16.1  09.6 - 04.5 (%)   Platelets 306  150 - 400 (K/uL)  COMPREHENSIVE METABOLIC PANEL     Status: Abnormal   Collection Time   02/08/11  4:23 PM      Component Value Range   Sodium 140  135 - 145 (mEq/L)   Potassium 3.7  3.5 - 5.1 (mEq/L)   Chloride 105  96 - 112 (mEq/L)   CO2 26  19 - 32 (mEq/L)   Glucose, Bld 86  70 - 99 (mg/dL)   BUN 14  6 - 23 (mg/dL)   Creatinine, Ser 4.09  0.50 - 1.10 (mg/dL)   Calcium 9.4  8.4 - 81.1 (mg/dL)   Total Protein 6.9  6.0 - 8.3 (g/dL)   Albumin 3.1 (*) 3.5 - 5.2 (g/dL)   AST 16  0 - 37 (U/L)   ALT 13  0 - 35 (U/L)   Alkaline Phosphatase 95  39 - 117 (U/L)   Total Bilirubin 0.3  0.3 - 1.2 (mg/dL)   GFR calc non Af Amer >90  >90 (mL/min)   GFR  calc Af Amer >90  >90 (mL/min)  LACTATE DEHYDROGENASE     Status: Normal   Collection Time   02/08/11  4:23 PM      Component Value Range   LD 189  94 - 250 (U/L)  URIC ACID     Status: Normal   Collection Time   02/08/11  4:23 PM      Component Value Range   Uric Acid, Serum 5.0  2.4 - 7.0 (mg/dL)  URINALYSIS, ROUTINE W REFLEX MICROSCOPIC     Status: Abnormal   Collection Time   02/08/11  5:03 PM      Component Value Range   Color, Urine YELLOW  YELLOW    APPearance CLEAR  CLEAR    Specific Gravity, Urine 1.020  1.005 - 1.030    pH 5.5  5.0 - 8.0    Glucose, UA NEGATIVE  NEGATIVE (mg/dL)   Hgb urine dipstick TRACE (*) NEGATIVE    Bilirubin Urine NEGATIVE  NEGATIVE    Ketones, ur NEGATIVE  NEGATIVE (mg/dL)   Protein, ur NEGATIVE  NEGATIVE (mg/dL)   Urobilinogen, UA 0.2  0.0 - 1.0 (mg/dL)   Nitrite NEGATIVE  NEGATIVE    Leukocytes, UA NEGATIVE  NEGATIVE   URINE MICROSCOPIC-ADD ON     Status: Abnormal   Collection Time   02/08/11  5:03 PM      Component Value Range   Squamous Epithelial / LPF FEW (*) RARE    WBC, UA 0-2  <3 (WBC/hpf)   RBC / HPF 0-2  <3 (RBC/hpf)   Bacteria, UA RARE  RARE    Filed Vitals:   02/08/11 1754 02/08/11 1835 02/08/11 2042 02/08/11 2116  BP: 152/104 147/103 141/98 138/85  Pulse: 65 65 66 65  TempSrc:      Resp:      SpO2:         MAU Course  Procedures   Assessment and Plan  S/p SVD on 1/4 Elevated BP PIH labs all normal  Discussed with pt risk of pre-eclampsia and using magnesium sulfate or beginning labetalol PO and starting a 24hour urine and f/u in office.  Pt desires to take labetalol now  and recheck BP then will decide.  Discussed with Dr. Normand Sloop.   Marjorie Lussier M 02/08/2011, 9:30 PM   Addendum  BP after labetalol 138/78 Pt desires to go home now Will D/C with RX for labetalol 200mg  PO BID 24hour urine collection to start tmrw AM, RTO thurs AM for BP check  Return if sx's worsen

## 2011-02-08 NOTE — ED Notes (Signed)
Gevena Barre CNM notified of pt pain and BP.  Will be down to see pt.

## 2011-02-10 ENCOUNTER — Inpatient Hospital Stay (HOSPITAL_COMMUNITY)
Admission: AD | Admit: 2011-02-10 | Discharge: 2011-02-10 | Disposition: A | Discharge status: Home / Self Care | Payer: BC Managed Care – PPO | Source: Ambulatory Visit | Attending: Obstetrics and Gynecology | Admitting: Obstetrics and Gynecology

## 2011-02-10 ENCOUNTER — Other Ambulatory Visit: Payer: Self-pay | Admitting: Obstetrics and Gynecology

## 2011-02-10 ENCOUNTER — Encounter (HOSPITAL_COMMUNITY): Payer: Self-pay | Admitting: *Deleted

## 2011-02-10 DIAGNOSIS — R51 Headache: Secondary | ICD-10-CM | POA: Insufficient documentation

## 2011-02-10 DIAGNOSIS — O165 Unspecified maternal hypertension, complicating the puerperium: Secondary | ICD-10-CM

## 2011-02-10 DIAGNOSIS — O99893 Other specified diseases and conditions complicating puerperium: Secondary | ICD-10-CM | POA: Insufficient documentation

## 2011-02-10 DIAGNOSIS — R030 Elevated blood-pressure reading, without diagnosis of hypertension: Secondary | ICD-10-CM | POA: Insufficient documentation

## 2011-02-10 LAB — LACTATE DEHYDROGENASE: LDH: 186 U/L (ref 94–250)

## 2011-02-10 LAB — CBC
HCT: 39 % (ref 36.0–46.0)
Hemoglobin: 12.7 g/dL (ref 12.0–15.0)
MCH: 29.6 pg (ref 26.0–34.0)
MCHC: 32.6 g/dL (ref 30.0–36.0)
MCV: 90.9 fL (ref 78.0–100.0)
Platelets: 307 10*3/uL (ref 150–400)
RBC: 4.29 MIL/uL (ref 3.87–5.11)
RDW: 13.8 % (ref 11.5–15.5)
WBC: 8.3 10*3/uL (ref 4.0–10.5)

## 2011-02-10 LAB — COMPREHENSIVE METABOLIC PANEL
ALT: 13 U/L (ref 0–35)
AST: 16 U/L (ref 0–37)
Albumin: 3.2 g/dL — ABNORMAL LOW (ref 3.5–5.2)
Alkaline Phosphatase: 92 U/L (ref 39–117)
BUN: 13 mg/dL (ref 6–23)
CO2: 28 mEq/L (ref 19–32)
Calcium: 9.6 mg/dL (ref 8.4–10.5)
Chloride: 104 mEq/L (ref 96–112)
Creatinine, Ser: 0.83 mg/dL (ref 0.50–1.10)
GFR calc Af Amer: >90 mL/min (ref >90)
GFR calc non Af Amer: 89 mL/min — ABNORMAL LOW (ref >90)
Glucose, Bld: 88 mg/dL (ref 70–99)
Potassium: 3.4 mEq/L — ABNORMAL LOW (ref 3.5–5.1)
Sodium: 140 mEq/L (ref 135–145)
Total Bilirubin: 0.3 mg/dL (ref 0.3–1.2)
Total Protein: 6.8 g/dL (ref 6.0–8.3)

## 2011-02-10 LAB — URIC ACID: Uric Acid, Serum: 5.3 mg/dL (ref 2.4–7.0)

## 2011-02-10 MED ORDER — NIFEDIPINE ER OSMOTIC RELEASE 30 MG PO TB24: 30.0000 mg | ORAL_TABLET | Freq: Every day | ORAL | Status: DC

## 2011-02-10 NOTE — Progress Notes (Signed)
Pt thought she was to be admitted.  Had been here yesterday, doesn't understand what is going on.  Brought directly to rm for eval

## 2011-02-10 NOTE — Progress Notes (Signed)
Pt states she was told that she was going to be a straight admission and was upset that she had to stop in MAU

## 2011-02-10 NOTE — ED Provider Notes (Signed)
History   36 yo [id number] s/p SVB on 1/4 presented from office for BP evaluation--BP at office 160/100, with right sided headache last several days.  Seen 1/22 for same complaint, with normal PIH labs and started on Labetalol 200 mg po BID that day.  Denies visual symptoms or epigastric pain.  No issues with BP during pregnancy or hospitalization. Last dose Labetalol at 9am today.  Urine trace protein today at office.  Turned in 24 hour urine to office late this afternoon--results pending.  Hx remarkable for: AMA Asthma Hx headaches in past ADHD Anemia BTL during recent hospitalization   OB History    Grav Para Term Preterm Abortions TAB SAB Ect Mult Living   4 3 3  0 1 0 1 0 0 3      Past Medical History  Diagnosis Date  . Edema   . Breast mass in female     benign  . Asthma   . ADHD (attention deficit hyperactivity disorder)   . Anemia   . Obese   . Varicosities   . Headache   . AMA (advanced maternal age) multigravida 35+     Past Surgical History  Procedure Date  . Cholecystectomy   . Closed reduction shoulder dislocation     left  . Colposcopy   . Tubal ligation 01/22/2011    Procedure: POST PARTUM TUBAL LIGATION;  Surgeon: Hal Morales, MD;  Location: [facility];  Service: Gynecology;  Laterality: Bilateral;  post partum tubal ligation bilateral    Family History  Problem Relation Age of Onset  . Other      non contributory  . Cancer Maternal Grandmother     throat  . Vision loss Cousin     History  Substance Use Topics  . Smoking status: Never Smoker   . Smokeless tobacco: Never Used  . Alcohol Use: No    Allergies: No Known Allergies  Prescriptions prior to admission  Medication Sig Dispense Refill  . ibuprofen (ADVIL,MOTRIN) 600 MG tablet Take 600 mg by mouth every 6 (six) hours as needed. Takes for pain      . labetalol (NORMODYNE) 200 MG tablet Take 1 tablet (200 mg total) by mouth 2 (two) times daily.  60 tablet  2  . Prenatal Vit-Fe  Fumarate-FA (PRENATAL MULTIVITAMIN) TABS Take 1 tablet by mouth daily.         Physical Exam   Blood pressure 130/92, pulse 74, unknown if currently breastfeeding.  Filed Vitals:   02/10/11 1531 02/10/11 1545 02/10/11 1602 02/10/11 1625  BP: 158/107 158/87 126/92 130/92  Pulse:  82 73 74   Chest clear Heart RRR without murmur Abd gravid, NT, BTL incision healing well Pelvic--deferred Uterus well-involuted, NT Ext DTR 2+ without clonus, trace edema   ED Course  20 days s/p SVB Elevated BP pp--on Labetalol 200 mg po BID since 02/08/11  Plan: Serial BPs PIH labs Will consult with Dr. Estanislado Pandy regarding plan of care after labs returned.  Patient would prefer to go home to await 24 hour urine results. Declines additional pain medication for headache.  Nigel Bridgeman, CNM, [place] 02/10/11 4:45pm  Addendum: Office called with 24 hour urine protein result = 145 mg/24 hours  Filed Vitals:   02/10/11 1531 02/10/11 1545 02/10/11 1602 02/10/11 1625  BP: 158/107 158/87 126/92 130/92  Pulse:  82 73 74   Results for orders placed during the hospital encounter of 02/10/11 (from the past 24 hour(s))  CBC     Status:  Normal   Collection Time   02/10/11  4:13 PM      Component Value Range   WBC 8.3  4.0 - 10.5 (K/uL)   RBC 4.29  3.87 - 5.11 (MIL/uL)   Hemoglobin 12.7  12.0 - 15.0 (g/dL)   HCT 19.1  47.8 - 29.5 (%)   MCV 90.9  78.0 - 100.0 (fL)   MCH 29.6  26.0 - 34.0 (pg)   MCHC 32.6  30.0 - 36.0 (g/dL)   RDW 62.1  30.8 - 65.7 (%)   Platelets 307  150 - 400 (K/uL)  COMPREHENSIVE METABOLIC PANEL     Status: Abnormal   Collection Time   02/10/11  4:13 PM      Component Value Range   Sodium 140  135 - 145 (mEq/L)   Potassium 3.4 (*) 3.5 - 5.1 (mEq/L)   Chloride 104  96 - 112 (mEq/L)   CO2 28  19 - 32 (mEq/L)   Glucose, Bld 88  70 - 99 (mg/dL)   BUN 13  6 - 23 (mg/dL)   Creatinine, Ser 8.46  0.50 - 1.10 (mg/dL)   Calcium 9.6  8.4 - 96.2 (mg/dL)   Total Protein 6.8  6.0 - 8.3 (g/dL)     Albumin 3.2 (*) 3.5 - 5.2 (g/dL)   AST 16  0 - 37 (U/L)   ALT 13  0 - 35 (U/L)   Alkaline Phosphatase 92  39 - 117 (U/L)   Total Bilirubin 0.3  0.3 - 1.2 (mg/dL)   GFR calc non Af Amer 89 (*) >90 (mL/min)   GFR calc Af Amer >90  >90 (mL/min)  LACTATE DEHYDROGENASE     Status: Normal   Collection Time   02/10/11  4:13 PM      Component Value Range   LD 186  94 - 250 (U/L)  URIC ACID     Status: Normal   Collection Time   02/10/11  4:13 PM      Component Value Range   Uric Acid, Serum 5.3  2.4 - 7.0 (mg/dL)   Consulted with Dr. Estanislado Pandy Will d/c Labetalol Start Procardia XL 30 mg po q day--1st dose tonight.  Patient requests Rx be given for patient to fill and start tonight (vs. Waiting on med from hospital pharmacy). Will have office contact [employer] tomorrow to see if they can get BP check--if they are unavailable, patient will be asked to stop by MAU for BP check with  Nigel Bridgeman, CNM tomorrow afternoon. Gestational hypertension precautions reviewed with patient. She is agreeable with plan of care.  Nigel Bridgeman, CNM, [place] 02/10/11 5:30pm

## 2011-03-24 ENCOUNTER — Encounter: Payer: Self-pay | Admitting: Obstetrics and Gynecology

## 2011-06-15 ENCOUNTER — Other Ambulatory Visit: Payer: Self-pay | Admitting: Obstetrics and Gynecology

## 2011-06-21 NOTE — Telephone Encounter (Signed)
Procardia ok'd one time.  Per last note, pt to continue and return for AEX.  Sch for 08-03-11  ld

## 2011-08-03 ENCOUNTER — Ambulatory Visit: Payer: Self-pay | Admitting: Obstetrics and Gynecology

## 2011-09-22 ENCOUNTER — Telehealth: Payer: Self-pay | Admitting: Obstetrics and Gynecology

## 2011-09-22 ENCOUNTER — Telehealth: Payer: Self-pay

## 2011-09-22 NOTE — Telephone Encounter (Signed)
PC from pt stating she needs letter for unemployment office.  Pt lost job and needs the letter to state that she was being treated for PP depression and was on medication. Pt trying to obtain unemployment and hearing is scheduled for 10-02-11.  Will need letter for that hearing.  Will send request to SR.  ld

## 2011-09-22 NOTE — Telephone Encounter (Signed)
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  Pt has been having some personal issues and had ended up being terminated from her job.  She is requesting a letter from [employer] stating that she was dx'd w/ PP depression and put on anti-depressants bc she is trying to get unemployment, has a hearing on 9/15.  Pt informed will make SR or assistant aware of request, pt voices agreement.  LD given msg and chart.

## 2011-09-22 NOTE — Telephone Encounter (Signed)
Triage/postpartum

## 2011-09-26 NOTE — Telephone Encounter (Signed)
OK to provide letter that pt had a visit 02/2011 and was diagnosed with PP depression and given Zoloft 50 mg daily. Pt did not return for follow-up

## 2011-09-27 ENCOUNTER — Encounter: Payer: Self-pay | Admitting: Obstetrics and Gynecology

## 2011-09-27 NOTE — Telephone Encounter (Signed)
Msg sent to [facility] to do letter for pt and notify for pick up.  ld

## 2012-04-05 ENCOUNTER — Emergency Department (HOSPITAL_COMMUNITY)
Admission: EM | Admit: 2012-04-05 | Discharge: 2012-04-05 | Disposition: A | Discharge status: Home / Self Care | Payer: 59 | Source: Home / Self Care

## 2012-04-05 ENCOUNTER — Encounter (HOSPITAL_COMMUNITY): Payer: Self-pay | Admitting: Emergency Medicine

## 2012-04-05 DIAGNOSIS — N390 Urinary tract infection, site not specified: Secondary | ICD-10-CM

## 2012-04-05 LAB — POCT URINALYSIS DIP (DEVICE)
Bilirubin Urine: NEGATIVE
Ketones, ur: NEGATIVE mg/dL
pH: 7 (ref 5.0–8.0)

## 2012-04-05 MED ORDER — CEPHALEXIN 500 MG PO CAPS: 500.0000 mg | ORAL_CAPSULE | Freq: Four times a day (QID) | ORAL | Status: DC

## 2012-04-05 NOTE — ED Notes (Signed)
Pt is c/o dysuria and burning with urinating for 3 to 4 days.  Pt denies any other symptoms.

## 2012-04-05 NOTE — ED Provider Notes (Signed)
History     CSN: [id number]  Arrival date & time 04/05/12  1858   None     Chief Complaint  Patient presents with  . Urinary Tract Infection    dysuria and burning with urinating    (Consider location/radiation/quality/duration/timing/severity/associated sxs/prior treatment) HPI Comments: 37 year old female presents with a 2 day history of dysuria, urinary frequency and urgency. Is also having mild intermittent suprapubic cramping. She denies vaginal discharge or irritation. She also denies fever, back pain, abdominal pain or GI symptoms.   Past Medical History  Diagnosis Date  . Edema   . Breast mass in female     benign  . Asthma   . ADHD (attention deficit hyperactivity disorder)   . Anemia   . Obese   . Varicosities   . Headache   . AMA (advanced maternal age) multigravida 35+     Past Surgical History  Procedure Laterality Date  . Cholecystectomy    . Closed reduction shoulder dislocation      left  . Colposcopy    . Tubal ligation  01/22/2011    Procedure: POST PARTUM TUBAL LIGATION;  Surgeon: Hal Morales, MD;  Location: [facility];  Service: Gynecology;  Laterality: Bilateral;  post partum tubal ligation bilateral    Family History  Problem Relation Age of Onset  . Other      non contributory  . Cancer Maternal Grandmother     throat  . Vision loss Cousin     History  Substance Use Topics  . Smoking status: Never Smoker   . Smokeless tobacco: Never Used  . Alcohol Use: No    OB History   Grav Para Term Preterm Abortions TAB SAB Ect Mult Living   4 3 3  0 1 0 1 0 0 3      Review of Systems  Constitutional: Negative.   HENT: Negative.   Respiratory: Negative.   Cardiovascular: Negative.   Gastrointestinal: Negative.   Genitourinary:       As per history of present illness  Musculoskeletal: Negative.   Psychiatric/Behavioral: Negative.     Allergies  Review of patient's allergies indicates no known allergies.  Home Medications    Current Outpatient Rx  Name  Route  Sig  Dispense  Refill  . cephALEXin (KEFLEX) 500 MG capsule   Oral   Take 1 capsule (500 mg total) by mouth 4 (four) times daily.   28 capsule   0   . ibuprofen (ADVIL,MOTRIN) 600 MG tablet   Oral   Take 600 mg by mouth every 6 (six) hours as needed. Takes for pain         . NIFEdipine (PROCARDIA XL/ADALAT-CC) 30 MG 24 hr tablet      TAKE 1 TABLET EVERY DAY   30 tablet   0   . Prenatal Vit-Fe Fumarate-FA (PRENATAL MULTIVITAMIN) TABS   Oral   Take 1 tablet by mouth daily.           LMP 03/28/2012  Breastfeeding? No  Physical Exam  Nursing note and vitals reviewed. Constitutional: She is oriented to person, place, and time. She appears well-developed and well-nourished. No distress.  Eyes: Conjunctivae and EOM are normal.  Neck: Neck supple.  Cardiovascular: Normal rate, regular rhythm and normal heart sounds.   Pulmonary/Chest: Effort normal and breath sounds normal. No respiratory distress.  Abdominal: Soft. Bowel sounds are normal. She exhibits no distension and no mass. There is no tenderness. There is no rebound and no guarding.  Musculoskeletal: She exhibits no edema and no tenderness.  Neurological: She is alert and oriented to person, place, and time. She exhibits normal muscle tone.  Skin: Skin is warm and dry.  Psychiatric: She has a normal mood and affect.    ED Course  Procedures (including critical care time)  Labs Reviewed  POCT URINALYSIS DIP (DEVICE) - Abnormal; Notable for the following:    Leukocytes, UA SMALL (*)    All other components within normal limits  URINE CULTURE   No results found. No information on file. Results for orders placed during the hospital encounter of 04/05/12  POCT URINALYSIS DIP (DEVICE)      Result Value Range   Glucose, UA NEGATIVE  NEGATIVE mg/dL   Bilirubin Urine NEGATIVE  NEGATIVE   Ketones, ur NEGATIVE  NEGATIVE mg/dL   Specific Gravity, Urine 1.010  1.005 - 1.030    Hgb urine dipstick NEGATIVE  NEGATIVE   pH 7.0  5.0 - 8.0   Protein, ur NEGATIVE  NEGATIVE mg/dL   Urobilinogen, UA 0.2  0.0 - 1.0 mg/dL   Nitrite NEGATIVE  NEGATIVE   Leukocytes, UA SMALL (*) NEGATIVE     1. UTI (lower urinary tract infection)       MDM  Keflex 500 mg 4 times a day for 7 days Drink plenty of fluids and stay well-hydrated Azo 3 times a day when necessary urinary symptoms Urine culture obtained For any problems he symptoms or worsening may return.        Hayden Rasmussen, NP 04/05/12 [phone]

## 2012-04-07 LAB — URINE CULTURE: Colony Count: 70000

## 2012-04-10 NOTE — ED Provider Notes (Signed)
Medical screening examination/treatment/procedure(s) were performed by resident physician or non-physician practitioner and as supervising physician I was immediately available for consultation/collaboration.   KINDL,JAMES DOUGLAS MD.   James D Kindl, MD 04/10/12 1004 

## 2012-04-17 ENCOUNTER — Ambulatory Visit (INDEPENDENT_AMBULATORY_CARE_PROVIDER_SITE_OTHER): Payer: 59 | Admitting: Family Medicine

## 2012-04-17 ENCOUNTER — Encounter: Payer: Self-pay | Admitting: Family Medicine

## 2012-04-17 VITALS — BP 140/90 | HR 94 | Temp 98.6°F | Ht 64.5 in | Wt 220.2 lb

## 2012-04-17 DIAGNOSIS — F988 Other specified behavioral and emotional disorders with onset usually occurring in childhood and adolescence: Secondary | ICD-10-CM

## 2012-04-17 MED ORDER — METHYLPHENIDATE HCL ER (OSM) 18 MG PO TBCR: 18.0000 mg | EXTENDED_RELEASE_TABLET | ORAL | Status: DC

## 2012-04-17 MED ORDER — METHYLPHENIDATE HCL ER (OSM) 54 MG PO TBCR: 54.0000 mg | EXTENDED_RELEASE_TABLET | Freq: Every day | ORAL | Status: DC

## 2012-04-17 NOTE — Assessment & Plan Note (Signed)
Pt would like to restart medication to better control sxs.  54 mg is wearing off before the end of her day.  Will add the 18 mg at lunch the way she was previously taking.  Will monitor closely for BP elevation.  Controlled substance agreement signed and UDS collected.

## 2012-04-17 NOTE — Patient Instructions (Addendum)
Follow up in 6-8 weeks to recheck BP Start the Concerta 54 mg daily in the AM Add the 18mg  at Lunch to prevent breakthrough symptoms Call with any questions or concerns Congrats!!!

## 2012-04-17 NOTE — Progress Notes (Signed)
  Subjective:    Patient ID: Ana Gray, female    DOB: 1976/01/16, 37 y.o.   MRN: [id number]  HPI ADHD- chronic problem, stopped meds during pregnancy and didn't resume until she stopped breastfeeding (daughter was 10 months).  Pt reports BP was elevated post-partum and has taken some time to normalize.  Continues to fluctuate, particularly w/ stress.  Not currently on meds.  Pt had some left over 54mg  tabs that she restarted recently.  Reports she did fine on this, no BP elevation, no insomnia, no palpitations, no unexplained anorexia.  Pt reports med effect would wear off by 2pm.   Review of Systems For ROS see HPI     Objective:   Physical Exam  Vitals reviewed. Constitutional: She is oriented to person, place, and time. She appears well-developed and well-nourished. No distress.  HENT:  Head: Normocephalic and atraumatic.  Neck: Neck supple. No thyromegaly present.  Cardiovascular: Normal rate, regular rhythm, normal heart sounds and intact distal pulses.   Pulmonary/Chest: Effort normal and breath sounds normal. No respiratory distress. She has no wheezes. She has no rales.  Musculoskeletal: She exhibits no edema.  Lymphadenopathy:    She has no cervical adenopathy.  Neurological: She is alert and oriented to person, place, and time.  Skin: Skin is warm and dry.  Psychiatric: She has a normal mood and affect. Her behavior is normal.          Assessment & Plan:

## 2012-04-18 ENCOUNTER — Other Ambulatory Visit: Payer: Self-pay | Admitting: *Deleted

## 2012-04-18 NOTE — Telephone Encounter (Signed)
Received fax from [employer] requesting PA for Concerta, this was initiated.

## 2012-04-19 ENCOUNTER — Telehealth: Payer: Self-pay | Admitting: *Deleted

## 2012-04-19 NOTE — Telephone Encounter (Signed)
PA form received , completed and faxed back to [facility] [phone].

## 2012-04-30 NOTE — Telephone Encounter (Addendum)
Prior Auth approved April 22 2012 until January 16, 2038. Approval letter scan to chart, pharmacy faxed.

## 2012-05-17 ENCOUNTER — Encounter: Payer: Self-pay | Admitting: Family Medicine

## 2012-05-21 ENCOUNTER — Telehealth: Payer: Self-pay | Admitting: Family Medicine

## 2012-05-21 MED ORDER — METHYLPHENIDATE HCL ER (OSM) 54 MG PO TBCR: 54.0000 mg | EXTENDED_RELEASE_TABLET | Freq: Every day | ORAL | Status: DC

## 2012-05-21 MED ORDER — METHYLPHENIDATE HCL ER (OSM) 18 MG PO TBCR: 18.0000 mg | EXTENDED_RELEASE_TABLET | ORAL | Status: DC

## 2012-05-21 NOTE — Telephone Encounter (Signed)
Ok for refills but under new policy needs to come pick up meds b/c a signature is required

## 2012-05-21 NOTE — Telephone Encounter (Signed)
Last refill:04-17-12 Last OV:04-17-12 Please advise.//AB/CMA

## 2012-05-21 NOTE — Telephone Encounter (Signed)
Rx's printed and put up front for the pt to pick-up.  LM @ (2:57pm) informing the pt that she will need to pick-up the rx's b/c she will need to sign for them.   Asked the pt to call me back if she has any questions.//AB/CMA

## 2012-05-21 NOTE — Telephone Encounter (Signed)
Patient called requesting rx for concerta 18 mg and 54 mg be mailed to her home address. Patient states we have done this for her in the past.

## 2012-06-12 ENCOUNTER — Ambulatory Visit: Payer: 59 | Admitting: Family Medicine

## 2012-06-20 ENCOUNTER — Telehealth: Payer: Self-pay | Admitting: Family Medicine

## 2012-06-20 MED ORDER — METHYLPHENIDATE HCL ER (OSM) 18 MG PO TBCR: 18.0000 mg | EXTENDED_RELEASE_TABLET | ORAL | Status: DC

## 2012-06-20 MED ORDER — METHYLPHENIDATE HCL ER (OSM) 54 MG PO TBCR: 54.0000 mg | EXTENDED_RELEASE_TABLET | Freq: Every day | ORAL | Status: DC

## 2012-06-20 NOTE — Telephone Encounter (Signed)
Last refilled:05-21-12 Last OV:04-17-12-has an appt on 07-10-12 Please advise.//AB/CMA

## 2012-06-20 NOTE — Telephone Encounter (Signed)
Ok for #30 of each 

## 2012-06-20 NOTE — Telephone Encounter (Signed)
Rx's printed and will be put up front for the pt to pick-up.  LMOM informing the pt that Rx's are ready and she can pick them up on Thurs mid morning.  Asked the pt to call back if she has any questions.//AB/CMA

## 2012-07-10 ENCOUNTER — Encounter: Payer: Self-pay | Admitting: Family Medicine

## 2012-07-10 ENCOUNTER — Ambulatory Visit (INDEPENDENT_AMBULATORY_CARE_PROVIDER_SITE_OTHER): Payer: 59 | Admitting: Family Medicine

## 2012-07-10 VITALS — BP 110/86 | HR 100 | Temp 98.5°F | Ht 64.5 in | Wt 201.6 lb

## 2012-07-10 DIAGNOSIS — F988 Other specified behavioral and emotional disorders with onset usually occurring in childhood and adolescence: Secondary | ICD-10-CM

## 2012-07-10 MED ORDER — METHYLPHENIDATE HCL ER (OSM) 18 MG PO TBCR: 18.0000 mg | EXTENDED_RELEASE_TABLET | ORAL | Status: DC

## 2012-07-10 MED ORDER — METHYLPHENIDATE HCL ER (OSM) 54 MG PO TBCR: 54.0000 mg | EXTENDED_RELEASE_TABLET | Freq: Every day | ORAL | Status: DC

## 2012-07-10 MED ORDER — ATOMOXETINE HCL 40 MG PO CAPS: 40.0000 mg | ORAL_CAPSULE | Freq: Every day | ORAL | Status: DC

## 2012-07-10 NOTE — Patient Instructions (Addendum)
Follow up in 4-6 weeks to recheck symptoms Continue the Concerta 54 + 18 for a max dose of 72mg  daily Add the Strattera daily Call with any questions or concerns Hang in there!!

## 2012-07-10 NOTE — Progress Notes (Signed)
  Subjective:    Patient ID: Ana Gray, female    DOB: 08/23/1975, 37 y.o.   MRN: [id number]  HPI ADD- pt reports the 54 mg dose is wearing off too early and the 18 mg dose in the afternoon is not enough.  Has been taking 2 of the 18mg  tabs but still finds this is not enough.  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  Wants to increase meds or find another option.   Review of Systems For ROS see HPI     Objective:   Physical Exam  Vitals reviewed. Constitutional: She is oriented to person, place, and time. She appears well-developed and well-nourished. No distress.  Cardiovascular: Normal rate, regular rhythm, normal heart sounds and intact distal pulses.   No murmur heard. Pulmonary/Chest: Effort normal and breath sounds normal. No respiratory distress. She has no wheezes. She has no rales.  Neurological: She is alert and oriented to person, place, and time.  Skin: Skin is warm and dry.  Psychiatric: She has a normal mood and affect. Her behavior is normal. Thought content normal.          Assessment & Plan:

## 2012-07-10 NOTE — Assessment & Plan Note (Signed)
Pt feels sxs were improving but now has tolerance to med and its effects.  Is taking more meds than max dose which is not ok.  Will stop stimulant at max dose and add starting dose of strattera.  Will follow closely.

## 2012-09-03 ENCOUNTER — Telehealth: Payer: Self-pay | Admitting: Family Medicine

## 2012-09-03 MED ORDER — METHYLPHENIDATE HCL ER (OSM) 18 MG PO TBCR: 18.0000 mg | EXTENDED_RELEASE_TABLET | ORAL | Status: DC

## 2012-09-03 MED ORDER — METHYLPHENIDATE HCL ER (OSM) 54 MG PO TBCR: 54.0000 mg | EXTENDED_RELEASE_TABLET | Freq: Every day | ORAL | Status: DC

## 2012-09-03 NOTE — Telephone Encounter (Signed)
Patient is calling to request a refill on her medication:  methylphenidate (CONCERTA) 18 MG. Please advise.

## 2012-09-03 NOTE — Telephone Encounter (Signed)
Meds filled. Pt notified that  Rx would be ready for pick up today.

## 2012-09-03 NOTE — Telephone Encounter (Signed)
Concerta 18mg  last filled on 07-10-2012 Concerta 54mg  was also filled same day. Please advise if Pt should be on both meds? And if ok to fill the 18mg .

## 2012-09-03 NOTE — Telephone Encounter (Signed)
Pt was on both meds.  Ok to refill 18mg .  Does she also need the 54?

## 2012-09-14 ENCOUNTER — Encounter: Payer: Self-pay | Admitting: Family Medicine

## 2012-09-14 ENCOUNTER — Ambulatory Visit (INDEPENDENT_AMBULATORY_CARE_PROVIDER_SITE_OTHER): Payer: BC Managed Care – PPO | Admitting: Family Medicine

## 2012-09-14 VITALS — BP 122/82 | HR 82 | Temp 98.0°F | Ht 64.5 in | Wt 205.8 lb

## 2012-09-14 DIAGNOSIS — Z0000 Encounter for general adult medical examination without abnormal findings: Secondary | ICD-10-CM

## 2012-09-14 DIAGNOSIS — F988 Other specified behavioral and emotional disorders with onset usually occurring in childhood and adolescence: Secondary | ICD-10-CM

## 2012-09-14 LAB — LIPID PANEL
HDL: 46.9 mg/dL (ref >39.00)
Total CHOL/HDL Ratio: 3
VLDL: 11.8 mg/dL (ref 0.0–40.0)

## 2012-09-14 LAB — BASIC METABOLIC PANEL
GFR: 90.15 mL/min (ref >60.00)
Potassium: 3.5 mEq/L (ref 3.5–5.1)
Sodium: 138 mEq/L (ref 135–145)

## 2012-09-14 LAB — HEPATIC FUNCTION PANEL
AST: 11 U/L (ref 0–37)
Alkaline Phosphatase: 64 U/L (ref 39–117)
Total Bilirubin: 0.2 mg/dL — ABNORMAL LOW (ref 0.3–1.2)

## 2012-09-14 LAB — CBC WITH DIFFERENTIAL/PLATELET
Eosinophils Absolute: 0.4 10*3/uL (ref 0.0–0.7)
MCHC: 33.7 g/dL (ref 30.0–36.0)
MCV: 90.2 fl (ref 78.0–100.0)
Monocytes Absolute: 0.4 10*3/uL (ref 0.1–1.0)
Neutrophils Relative %: 55.8 % (ref 43.0–77.0)
Platelets: 228 10*3/uL (ref 150.0–400.0)

## 2012-09-14 LAB — TSH: TSH: 1.8 u[IU]/mL (ref 0.35–5.50)

## 2012-09-14 MED ORDER — AMPHETAMINE-DEXTROAMPHET ER 20 MG PO CP24: 20.0000 mg | ORAL_CAPSULE | ORAL | Status: DC

## 2012-09-14 NOTE — Assessment & Plan Note (Signed)
Pt reports strattera was working but cost $100/month and made her sick for 3 hrs after taking- including vomiting.  Concerta alone is not effective.  Switch to Adderall.  Reviewed supportive care and red flags that should prompt return.  Pt expressed understanding and is in agreement w/ plan.

## 2012-09-14 NOTE — Assessment & Plan Note (Signed)
Pt's PE WNL.  UTD on GYN.  Check labs.  Anticipatory guidance provided.  

## 2012-09-14 NOTE — Patient Instructions (Addendum)
Follow up in 1 year or as needed We'll notify you of your lab results and make any changes if needed Call with any questions or concerns Happy Labor Day!

## 2012-09-14 NOTE — Progress Notes (Signed)
  Subjective:    Patient ID: Ana Gray, female    DOB: 11/22/1975, 37 y.o.   MRN: [id number]  HPI CPE- no concerns, UTD on GYN.   Review of Systems Patient reports no vision/ hearing changes, adenopathy,fever, weight change,  persistant/recurrent hoarseness , swallowing issues, chest pain, palpitations, edema, persistant/recurrent cough, hemoptysis, dyspnea (rest/exertional/paroxysmal nocturnal), gastrointestinal bleeding (melena, rectal bleeding), abdominal pain, significant heartburn, bowel changes, GU symptoms (dysuria, hematuria, incontinence), Gyn symptoms (abnormal  bleeding, pain),  syncope, focal weakness, memory loss, numbness & tingling, skin/hair/nail changes, abnormal bruising or bleeding, anxiety, or depression.     Objective:   Physical Exam General Appearance:    Alert, cooperative, no distress, appears stated age  Head:    Normocephalic, without obvious abnormality, atraumatic  Eyes:    PERRL, conjunctiva/corneas clear, EOM's intact, fundi    benign, both eyes  Ears:    Normal TM's and external ear canals, both ears  Nose:   Nares normal, septum midline, mucosa normal, no drainage    or sinus tenderness  Throat:   Lips, mucosa, and tongue normal; teeth and gums normal  Neck:   Supple, symmetrical, trachea midline, no adenopathy;    Thyroid: no enlargement/tenderness/nodules  Back:     Symmetric, no curvature, ROM normal, no CVA tenderness  Lungs:     Clear to auscultation bilaterally, respirations unlabored  Chest Wall:    No tenderness or deformity   Heart:    Regular rate and rhythm, S1 and S2 normal, no murmur, rub   or gallop  Breast Exam:    Deferred to GYN  Abdomen:     Soft, non-tender, bowel sounds active all four quadrants,    no masses, no organomegaly  Genitalia:    Deferred to GYN  Rectal:    Extremities:   Extremities normal, atraumatic, no cyanosis or edema  Pulses:   2+ and symmetric all extremities  Skin:   Skin color, texture, turgor normal,  no rashes or lesions  Lymph nodes:   Cervical, supraclavicular, and axillary nodes normal  Neurologic:   CNII-XII intact, normal strength, sensation and reflexes    throughout          Assessment & Plan:

## 2012-09-21 LAB — VITAMIN D 1,25 DIHYDROXY: Vitamin D 1, 25 (OH)2 Total: 54 pg/mL (ref 18–72)

## 2012-10-17 ENCOUNTER — Telehealth: Payer: Self-pay | Admitting: Family Medicine

## 2012-10-17 NOTE — Telephone Encounter (Signed)
Patient is requesting refill of Adderall, has appt tomorrow w/ Dr. Beverely Low. Can refill at that time if appropriate.

## 2012-10-17 NOTE — Telephone Encounter (Signed)
Patient is calling with questions about her blood pressure. Please advise.

## 2012-10-17 NOTE — Telephone Encounter (Signed)
Pt scheduled tomorrow to discuss with tabori.

## 2012-10-18 ENCOUNTER — Encounter: Payer: Self-pay | Admitting: Family Medicine

## 2012-10-18 ENCOUNTER — Ambulatory Visit (INDEPENDENT_AMBULATORY_CARE_PROVIDER_SITE_OTHER): Payer: BC Managed Care – PPO | Admitting: Family Medicine

## 2012-10-18 VITALS — BP 124/82 | HR 104 | Temp 98.2°F | Resp 17 | Wt 206.1 lb

## 2012-10-18 DIAGNOSIS — R030 Elevated blood-pressure reading, without diagnosis of hypertension: Secondary | ICD-10-CM

## 2012-10-18 DIAGNOSIS — IMO0001 Reserved for inherently not codable concepts without codable children: Secondary | ICD-10-CM | POA: Insufficient documentation

## 2012-10-18 MED ORDER — AMPHETAMINE-DEXTROAMPHET ER 20 MG PO CP24: 20.0000 mg | ORAL_CAPSULE | ORAL | Status: DC

## 2012-10-18 NOTE — Assessment & Plan Note (Signed)
New.  All stress related.  Well controlled today- asymptomatic.  Encouraged pt to find a stress outlet.  Will follow closely.
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
## 2012-10-18 NOTE — Patient Instructions (Addendum)
Follow up in 1 month to recheck BP and stress Try and find a stress outlet- exercise, art, etc Call with any questions or concerns Hang in there!!

## 2012-11-14 ENCOUNTER — Other Ambulatory Visit: Payer: Self-pay | Admitting: Obstetrics and Gynecology

## 2012-11-14 LAB — HM PAP SMEAR

## 2012-11-22 ENCOUNTER — Ambulatory Visit: Payer: BC Managed Care – PPO | Admitting: Family Medicine

## 2012-11-22 ENCOUNTER — Other Ambulatory Visit: Payer: Self-pay

## 2012-12-05 ENCOUNTER — Encounter: Payer: Self-pay | Admitting: Family Medicine

## 2012-12-05 ENCOUNTER — Ambulatory Visit (INDEPENDENT_AMBULATORY_CARE_PROVIDER_SITE_OTHER): Payer: BC Managed Care – PPO | Admitting: Family Medicine

## 2012-12-05 VITALS — BP 130/88 | HR 87 | Temp 98.2°F | Resp 16 | Wt 207.5 lb

## 2012-12-05 DIAGNOSIS — F988 Other specified behavioral and emotional disorders with onset usually occurring in childhood and adolescence: Secondary | ICD-10-CM

## 2012-12-05 MED ORDER — AMPHETAMINE-DEXTROAMPHET ER 25 MG PO CP24: 25.0000 mg | ORAL_CAPSULE | ORAL | Status: DC

## 2012-12-05 NOTE — Progress Notes (Signed)
  Subjective:    Patient ID: Ana Gray, female    DOB: 03-16-1975, 37 y.o.   MRN: [id number]  HPI ADD- chronic problem, feels Adderall is working but 'not lasting'.  Meds are typically wearing off by noon.  Feels she needs a dose adjustment.  No palpitations, HAs, insomnia, anorexia.   Review of Systems For ROS see HPI     Objective:   Physical Exam  Vitals reviewed. Constitutional: She is oriented to person, place, and time. She appears well-developed and well-nourished. No distress.  Cardiovascular: Normal rate, regular rhythm, normal heart sounds and intact distal pulses.   Pulmonary/Chest: Effort normal and breath sounds normal. No respiratory distress. She has no wheezes. She has no rales.  Neurological: She is alert and oriented to person, place, and time.  Skin: Skin is warm and dry.  Psychiatric: She has a normal mood and affect. Her behavior is normal.          Assessment & Plan:

## 2012-12-05 NOTE — Patient Instructions (Signed)
Follow up as needed Increase to 25mg  daily Call if dose adjustment is not helping Call with any questions or concerns Happy Belated Birthday! Happy Thanksgiving!!

## 2012-12-05 NOTE — Assessment & Plan Note (Signed)
Pt is tolerating Adderall w/out difficulty but effects are wearing off by lunch.  Increase dose to 25mg  daily.  If no improvement in duration of effect, may need to add short acting med to complete her work day.  Will follow.

## 2013-01-15 ENCOUNTER — Telehealth: Payer: Self-pay | Admitting: *Deleted

## 2013-01-15 MED ORDER — AMPHETAMINE-DEXTROAMPHET ER 30 MG PO CP24: 30.0000 mg | ORAL_CAPSULE | ORAL | Status: DC

## 2013-01-15 NOTE — Telephone Encounter (Signed)
We can increase to 30mg  (extended release) but that's the highest dose available.  #30, no refills

## 2013-01-15 NOTE — Telephone Encounter (Signed)
Patient notified and Rx placed up front for pick up. 

## 2013-01-15 NOTE — Telephone Encounter (Signed)
Patient called and stated that she had her Adderall increase to 25mg  on her last visit (12/05/2012). Patient states that she have not noticed any difference with the increase. Patient would like to know if you want to increase it again or try a different medication. Please advise. SW  Last seen-12/05/2012  Last filled-12/05/2012  UDS-04/17/2012 low risk, contract signed

## 2013-03-04 ENCOUNTER — Telehealth: Payer: Self-pay | Admitting: Family Medicine

## 2013-03-04 NOTE — Telephone Encounter (Signed)
Patient called requesting rx for adderall 30 mg. Call [phone] when ready for pick up.

## 2013-03-06 MED ORDER — AMPHETAMINE-DEXTROAMPHET ER 30 MG PO CP24: 30.0000 mg | ORAL_CAPSULE | ORAL | Status: DC

## 2013-03-06 NOTE — Telephone Encounter (Signed)
Yes, rx printed 

## 2013-03-06 NOTE — Telephone Encounter (Signed)
Patient is requesting a refill on Adderall. Ana Gray patient) Last OV 12/05/12 Last filled 01/15/13 #30 UDS 04/2012 Low Risk Okay to refill?

## 2013-04-04 ENCOUNTER — Telehealth: Payer: Self-pay | Admitting: Family Medicine

## 2013-04-04 MED ORDER — AMPHETAMINE-DEXTROAMPHET ER 30 MG PO CP24: 30.0000 mg | ORAL_CAPSULE | ORAL | Status: DC

## 2013-04-04 NOTE — Telephone Encounter (Signed)
Med filled and pt notified.  

## 2013-04-04 NOTE — Telephone Encounter (Signed)
Patient called stating that she left two messages on the triage line a few days ago and has not heard anything back form [place]. Patient is requesting a refill for adderall.

## 2013-05-13 ENCOUNTER — Telehealth: Payer: Self-pay | Admitting: Family Medicine

## 2013-05-13 MED ORDER — AMPHETAMINE-DEXTROAMPHET ER 30 MG PO CP24: 30.0000 mg | ORAL_CAPSULE | ORAL | Status: DC

## 2013-05-13 NOTE — Telephone Encounter (Signed)
Med filled.  

## 2013-05-13 NOTE — Telephone Encounter (Signed)
Caller name: Theona Relation to pt: Call back number: [phone] Pharmacy: pick up  Reason for call: pt is needing a refill on RX amphetamine-dextroamphetamine (ADDERALL XR) 30 MG 24 hr capsule

## 2013-06-24 ENCOUNTER — Telehealth: Payer: Self-pay | Admitting: Family Medicine

## 2013-06-24 MED ORDER — AMPHETAMINE-DEXTROAMPHET ER 30 MG PO CP24: 30.0000 mg | ORAL_CAPSULE | ORAL | Status: DC

## 2013-06-24 NOTE — Telephone Encounter (Signed)
Med filled and pt notified.  

## 2013-06-24 NOTE — Telephone Encounter (Signed)
Ok for #30, needs UDS 

## 2013-06-24 NOTE — Telephone Encounter (Signed)
Caller name:Terrah Odetta Pink  Relation to KH:[id number] Call back number: [phone] Pharmacy:  Reason for call: patient called to request a refill for adderall

## 2013-06-24 NOTE — Telephone Encounter (Signed)
Last OV 12-05-12 adderall filled 05-13-13 #30 with 0  CSC signed, UDS due, NO upcoming appts

## 2013-07-30 ENCOUNTER — Telehealth: Payer: Self-pay | Admitting: Family Medicine

## 2013-07-30 ENCOUNTER — Other Ambulatory Visit: Payer: Self-pay | Admitting: Family Medicine

## 2013-07-30 MED ORDER — AMPHETAMINE-DEXTROAMPHET ER 30 MG PO CP24: 30.0000 mg | ORAL_CAPSULE | ORAL | Status: DC

## 2013-07-30 NOTE — Telephone Encounter (Signed)
Caller name:Lucillia Relation to LK:[id number] Call back number: [phone] Pharmacy:  Reason for call: Patient called to request a refill for adderall

## 2013-07-30 NOTE — Telephone Encounter (Signed)
[facility] for refill but pt needs to schedule CPE (due in Aug)

## 2013-07-30 NOTE — Telephone Encounter (Signed)
Last OV 12-05-12 No upcoming appts Last refill 06-24-13 #30 with 0

## 2013-07-30 NOTE — Telephone Encounter (Signed)
Med filled and pt notified.  

## 2013-08-28 ENCOUNTER — Telehealth: Payer: Self-pay

## 2013-08-28 MED ORDER — AMPHETAMINE-DEXTROAMPHET ER 30 MG PO CP24: 30.0000 mg | ORAL_CAPSULE | ORAL | Status: DC

## 2013-08-28 NOTE — Telephone Encounter (Signed)
Med filled and pt notified.  

## 2013-08-28 NOTE — Telephone Encounter (Signed)
Ok for #30 

## 2013-08-28 NOTE — Telephone Encounter (Signed)
Caller name:Warrene Relation to pt: Call back number:[phone] Pharmacy:  Reason for call: Ana Gray called she is running out of her amphetamine-dextroamphetamine (ADDERALL XR) 30 MG 24 hr capsule. She actually said she was calling two days early because she is leaving to go out of town on Friday, so she was hoping to pick up prescription today or tomorrow.

## 2013-08-28 NOTE — Telephone Encounter (Signed)
Last OV 12-05-12 Med filled 07-30-13 #30 with 0  Low Risk

## 2013-10-03 ENCOUNTER — Encounter: Payer: Self-pay | Admitting: Family Medicine

## 2013-10-03 ENCOUNTER — Ambulatory Visit (INDEPENDENT_AMBULATORY_CARE_PROVIDER_SITE_OTHER): Payer: BC Managed Care – PPO | Admitting: Family Medicine

## 2013-10-03 VITALS — BP 126/80 | HR 90 | Temp 98.0°F | Resp 16 | Ht 65.25 in | Wt 202.2 lb

## 2013-10-03 DIAGNOSIS — F988 Other specified behavioral and emotional disorders with onset usually occurring in childhood and adolescence: Secondary | ICD-10-CM

## 2013-10-03 DIAGNOSIS — Z23 Encounter for immunization: Secondary | ICD-10-CM

## 2013-10-03 DIAGNOSIS — Z0000 Encounter for general adult medical examination without abnormal findings: Secondary | ICD-10-CM

## 2013-10-03 MED ORDER — AMPHETAMINE-DEXTROAMPHETAMINE 20 MG PO TABS: 20.0000 mg | ORAL_TABLET | Freq: Every day | ORAL | Status: DC

## 2013-10-03 MED ORDER — AMPHETAMINE-DEXTROAMPHET ER 30 MG PO CP24: 30.0000 mg | ORAL_CAPSULE | ORAL | Status: DC

## 2013-10-03 NOTE — Progress Notes (Signed)
   Subjective:    Patient ID: Ana Gray, female    DOB: 1975/07/07, 38 y.o.   MRN: [id number]  HPI CPE- UTD on GYN.     Review of Systems Patient reports no vision/ hearing changes, adenopathy,fever, weight change,  persistant/recurrent hoarseness , swallowing issues, chest pain, palpitations, edema, persistant/recurrent cough, hemoptysis, dyspnea (rest/exertional/paroxysmal nocturnal), gastrointestinal bleeding (melena, rectal bleeding), abdominal pain, significant heartburn, bowel changes, GU symptoms (dysuria, hematuria, incontinence), Gyn symptoms (abnormal  bleeding, pain),  syncope, focal weakness, memory loss, numbness & tingling, skin/hair/nail changes, abnormal bruising or bleeding, anxiety, or depression.     Objective:   Physical Exam General Appearance:    Alert, cooperative, no distress, appears stated age  Head:    Normocephalic, without obvious abnormality, atraumatic  Eyes:    PERRL, conjunctiva/corneas clear, EOM's intact, fundi    benign, both eyes  Ears:    Normal TM's and external ear canals, both ears  Nose:   Nares normal, septum midline, mucosa normal, no drainage    or sinus tenderness  Throat:   Lips, mucosa, and tongue normal; teeth and gums normal  Neck:   Supple, symmetrical, trachea midline, no adenopathy;    Thyroid: no enlargement/tenderness/nodules  Back:     Symmetric, no curvature, ROM normal, no CVA tenderness  Lungs:     Clear to auscultation bilaterally, respirations unlabored  Chest Wall:    No tenderness or deformity   Heart:    Regular rate and rhythm, S1 and S2 normal, no murmur, rub   or gallop  Breast Exam:    Deferred to GYN  Abdomen:     Soft, non-tender, bowel sounds active all four quadrants,    no masses, no organomegaly  Genitalia:    Deferred to GYN  Rectal:    Extremities:   Extremities normal, atraumatic, no cyanosis or edema  Pulses:   2+ and symmetric all extremities  Skin:   Skin color, texture, turgor normal, no rashes  or lesions  Lymph nodes:   Cervical, supraclavicular, and axillary nodes normal  Neurologic:   CNII-XII intact, normal strength, sensation and reflexes    throughout          Assessment & Plan:

## 2013-10-03 NOTE — Patient Instructions (Addendum)
Follow up in 4-6 weeks to recheck ADD We'll notify you of your lab results and make any changes if needed Keep up the good work! Call with any questions or concerns Happy Fall!!!  Congrats on quitting your job!

## 2013-10-03 NOTE — Progress Notes (Signed)
Pre visit review using our clinic review tool, if applicable. No additional management support is needed unless otherwise documented below in the visit note. 

## 2013-10-04 ENCOUNTER — Encounter: Payer: Self-pay | Admitting: General Practice

## 2013-10-04 LAB — CBC WITH DIFFERENTIAL/PLATELET
Basophils Absolute: 0.1 10*3/uL (ref 0.0–0.1)
Basophils Relative: 0.4 % (ref 0.0–3.0)
EOS ABS: 0.3 10*3/uL (ref 0.0–0.7)
EOS PCT: 2.6 % (ref 0.0–5.0)
HCT: 41.1 % (ref 36.0–46.0)
Hemoglobin: 13.6 g/dL (ref 12.0–15.0)
LYMPHS PCT: 26.9 % (ref 12.0–46.0)
Lymphs Abs: 3.1 10*3/uL (ref 0.7–4.0)
MCHC: 33.2 g/dL (ref 30.0–36.0)
MCV: 92 fl (ref 78.0–100.0)
Monocytes Absolute: 0.5 10*3/uL (ref 0.1–1.0)
Monocytes Relative: 4.4 % (ref 3.0–12.0)
NEUTROS PCT: 65.7 % (ref 43.0–77.0)
Neutro Abs: 7.5 10*3/uL (ref 1.4–7.7)
PLATELETS: 247 10*3/uL (ref 150.0–400.0)
RBC: 4.46 Mil/uL (ref 3.87–5.11)
RDW: 13.7 % (ref 11.5–15.5)
WBC: 11.4 10*3/uL — AB (ref 4.0–10.5)

## 2013-10-04 LAB — HEPATIC FUNCTION PANEL
ALK PHOS: 67 U/L (ref 39–117)
ALT: 14 U/L (ref 0–35)
AST: 17 U/L (ref 0–37)
Albumin: 4.3 g/dL (ref 3.5–5.2)
BILIRUBIN TOTAL: 0.4 mg/dL (ref 0.2–1.2)
Bilirubin, Direct: 0 mg/dL (ref 0.0–0.3)
Total Protein: 8.4 g/dL — ABNORMAL HIGH (ref 6.0–8.3)

## 2013-10-04 LAB — LIPID PANEL
CHOL/HDL RATIO: 4
Cholesterol: 185 mg/dL (ref 0–200)
HDL: 46.2 mg/dL (ref >39.00)
LDL Cholesterol: 121 mg/dL — ABNORMAL HIGH (ref 0–99)
NONHDL: 138.8
TRIGLYCERIDES: 91 mg/dL (ref 0.0–149.0)
VLDL: 18.2 mg/dL (ref 0.0–40.0)

## 2013-10-04 LAB — TSH: TSH: 1.35 u[IU]/mL (ref 0.35–4.50)

## 2013-10-04 LAB — BASIC METABOLIC PANEL
BUN: 14 mg/dL (ref 6–23)
CHLORIDE: 104 meq/L (ref 96–112)
CO2: 25 meq/L (ref 19–32)
Calcium: 9.3 mg/dL (ref 8.4–10.5)
Creatinine, Ser: 0.9 mg/dL (ref 0.4–1.2)
GFR: 73.76 mL/min (ref >60.00)
Glucose, Bld: 73 mg/dL (ref 70–99)
POTASSIUM: 3.6 meq/L (ref 3.5–5.1)
Sodium: 139 mEq/L (ref 135–145)

## 2013-10-04 LAB — VITAMIN D 25 HYDROXY (VIT D DEFICIENCY, FRACTURES): VITD: 30.22 ng/mL (ref 30.00–100.00)

## 2013-10-04 NOTE — Assessment & Plan Note (Signed)
Pt reports 30mg  XR is not sufficient in controlling her sxs.  Will add short acting 20mg  for pt to take around lunch for better control of her afternoon symptoms.  If no improvement, will need to consider switching meds entirely or seeing psych for better control.

## 2013-10-04 NOTE — Assessment & Plan Note (Signed)
Pt's PE WNL w/ exception of obesity.  UTD on GYN.  Check labs.  Anticipatory guidance provided.  

## 2013-11-07 ENCOUNTER — Telehealth: Payer: Self-pay | Admitting: Family Medicine

## 2013-11-07 MED ORDER — AMPHETAMINE-DEXTROAMPHETAMINE 20 MG PO TABS: 20.0000 mg | ORAL_TABLET | Freq: Every day | ORAL | Status: DC

## 2013-11-07 MED ORDER — AMPHETAMINE-DEXTROAMPHET ER 30 MG PO CP24: 30.0000 mg | ORAL_CAPSULE | ORAL | Status: DC

## 2013-11-07 NOTE — Telephone Encounter (Signed)
Caller name: Genea  Call back number:[phone]   Reason for call:  Pt would like refill on Rx's amphetamine-dextroamphetamine (ADDERALL XR) 30 MG 24 hr  amphetamine-dextroamphetamine (ADDERALL) 20 MG tablet

## 2013-11-07 NOTE — Telephone Encounter (Signed)
Med filled and pt notified.  

## 2013-11-08 ENCOUNTER — Other Ambulatory Visit: Payer: Self-pay | Admitting: Obstetrics and Gynecology

## 2013-11-08 DIAGNOSIS — N6312 Unspecified lump in the right breast, upper inner quadrant: Secondary | ICD-10-CM

## 2013-11-15 ENCOUNTER — Ambulatory Visit: Payer: BC Managed Care – PPO | Admitting: Family Medicine

## 2013-11-18 ENCOUNTER — Encounter: Payer: Self-pay | Admitting: Family Medicine

## 2013-11-22 ENCOUNTER — Other Ambulatory Visit: Payer: BC Managed Care – PPO

## 2013-12-06 ENCOUNTER — Other Ambulatory Visit: Payer: BC Managed Care – PPO

## 2013-12-16 ENCOUNTER — Telehealth: Payer: Self-pay | Admitting: Family Medicine

## 2013-12-16 MED ORDER — AMPHETAMINE-DEXTROAMPHETAMINE 20 MG PO TABS: 20.0000 mg | ORAL_TABLET | Freq: Every day | ORAL | Status: DC

## 2013-12-16 MED ORDER — AMPHETAMINE-DEXTROAMPHET ER 30 MG PO CP24: 30.0000 mg | ORAL_CAPSULE | ORAL | Status: DC

## 2013-12-16 NOTE — Telephone Encounter (Signed)
Last OV 10-03-13 Both adderalls filled 11-07-13 #30 with 0

## 2013-12-16 NOTE — Telephone Encounter (Signed)
Med filled and pt notified.  

## 2013-12-16 NOTE — Telephone Encounter (Signed)
[facility] for refill on each

## 2013-12-16 NOTE — Telephone Encounter (Signed)
Patient is requesting refill on Adderall 20 and 30, call when ready for pick up

## 2013-12-25 ENCOUNTER — Ambulatory Visit
Admission: RE | Admit: 2013-12-25 | Discharge: 2013-12-25 | Disposition: A | Discharge status: Home / Self Care | Payer: Commercial Managed Care - PPO | Source: Ambulatory Visit | Attending: Obstetrics and Gynecology | Admitting: Obstetrics and Gynecology

## 2013-12-25 ENCOUNTER — Other Ambulatory Visit: Payer: Self-pay | Admitting: Obstetrics and Gynecology

## 2013-12-25 DIAGNOSIS — N6312 Unspecified lump in the right breast, upper inner quadrant: Secondary | ICD-10-CM

## 2014-01-20 ENCOUNTER — Telehealth: Payer: Self-pay | Admitting: Family Medicine

## 2014-01-20 MED ORDER — AMPHETAMINE-DEXTROAMPHET ER 30 MG PO CP24: 30.0000 mg | ORAL_CAPSULE | ORAL | Status: DC

## 2014-01-20 MED ORDER — AMPHETAMINE-DEXTROAMPHETAMINE 20 MG PO TABS: 20.0000 mg | ORAL_TABLET | Freq: Every day | ORAL | Status: DC

## 2014-01-20 NOTE — Telephone Encounter (Signed)
adderall 30 er and 20

## 2014-01-20 NOTE — Telephone Encounter (Signed)
Last OV 10-03-13 Both adderalls filled 12-16-13 #30 with 0

## 2014-01-20 NOTE — Telephone Encounter (Signed)
Ok for #30 of each 

## 2014-01-20 NOTE — Telephone Encounter (Signed)
Med filled and pt notified.  

## 2014-02-03 LAB — HM PAP SMEAR: HM Pap smear: NORMAL

## 2014-02-21 ENCOUNTER — Telehealth: Payer: Self-pay | Admitting: Family Medicine

## 2014-02-21 MED ORDER — AMPHETAMINE-DEXTROAMPHETAMINE 20 MG PO TABS: 20.0000 mg | ORAL_TABLET | Freq: Every day | ORAL | Status: DC

## 2014-02-21 MED ORDER — AMPHETAMINE-DEXTROAMPHET ER 30 MG PO CP24: 30.0000 mg | ORAL_CAPSULE | ORAL | Status: DC

## 2014-02-21 NOTE — Telephone Encounter (Signed)
Med filled per verbal from provider

## 2014-02-21 NOTE — Telephone Encounter (Signed)
Caller name: Yulisa, [place] Relation to pt: self  Call back number: [phone]   Reason for call:  Pt requesting a refill amphetamine-dextroamphetamine (ADDERALL XR) 30 MG 24 hr capsule  And amphetamine-dextroamphetamine (ADDERALL) 20 MG tablet

## 2014-02-28 ENCOUNTER — Emergency Department (HOSPITAL_BASED_OUTPATIENT_CLINIC_OR_DEPARTMENT_OTHER): Payer: Commercial Managed Care - PPO

## 2014-02-28 ENCOUNTER — Emergency Department (HOSPITAL_BASED_OUTPATIENT_CLINIC_OR_DEPARTMENT_OTHER)
Admission: EM | Admit: 2014-02-28 | Discharge: 2014-02-28 | Disposition: A | Discharge status: Home / Self Care | Payer: Commercial Managed Care - PPO | Attending: Emergency Medicine | Admitting: Emergency Medicine

## 2014-02-28 ENCOUNTER — Encounter (HOSPITAL_BASED_OUTPATIENT_CLINIC_OR_DEPARTMENT_OTHER): Payer: Self-pay | Admitting: *Deleted

## 2014-02-28 ENCOUNTER — Telehealth: Payer: Self-pay | Admitting: Family Medicine

## 2014-02-28 ENCOUNTER — Ambulatory Visit: Payer: Commercial Managed Care - PPO | Admitting: Family Medicine

## 2014-02-28 DIAGNOSIS — Z8742 Personal history of other diseases of the female genital tract: Secondary | ICD-10-CM | POA: Insufficient documentation

## 2014-02-28 DIAGNOSIS — Z862 Personal history of diseases of the blood and blood-forming organs and certain disorders involving the immune mechanism: Secondary | ICD-10-CM | POA: Insufficient documentation

## 2014-02-28 DIAGNOSIS — J45909 Unspecified asthma, uncomplicated: Secondary | ICD-10-CM | POA: Insufficient documentation

## 2014-02-28 DIAGNOSIS — R030 Elevated blood-pressure reading, without diagnosis of hypertension: Secondary | ICD-10-CM | POA: Insufficient documentation

## 2014-02-28 DIAGNOSIS — F909 Attention-deficit hyperactivity disorder, unspecified type: Secondary | ICD-10-CM | POA: Diagnosis not present

## 2014-02-28 DIAGNOSIS — E669 Obesity, unspecified: Secondary | ICD-10-CM | POA: Insufficient documentation

## 2014-02-28 DIAGNOSIS — Z8679 Personal history of other diseases of the circulatory system: Secondary | ICD-10-CM | POA: Insufficient documentation

## 2014-02-28 DIAGNOSIS — R200 Anesthesia of skin: Secondary | ICD-10-CM | POA: Insufficient documentation

## 2014-02-28 DIAGNOSIS — R079 Chest pain, unspecified: Secondary | ICD-10-CM | POA: Diagnosis present

## 2014-02-28 DIAGNOSIS — F419 Anxiety disorder, unspecified: Secondary | ICD-10-CM | POA: Insufficient documentation

## 2014-02-28 DIAGNOSIS — Z79899 Other long term (current) drug therapy: Secondary | ICD-10-CM | POA: Insufficient documentation

## 2014-02-28 DIAGNOSIS — R0789 Other chest pain: Secondary | ICD-10-CM | POA: Diagnosis not present

## 2014-02-28 DIAGNOSIS — R000 Tachycardia, unspecified: Secondary | ICD-10-CM | POA: Diagnosis not present

## 2014-02-28 LAB — CBC WITH DIFFERENTIAL/PLATELET
Basophils Absolute: 0 10*3/uL (ref 0.0–0.1)
Basophils Relative: 0 % (ref 0–1)
Eosinophils Absolute: 0.3 10*3/uL (ref 0.0–0.7)
Eosinophils Relative: 4 % (ref 0–5)
HCT: 42.2 % (ref 36.0–46.0)
Hemoglobin: 14 g/dL (ref 12.0–15.0)
LYMPHS ABS: 2.7 10*3/uL (ref 0.7–4.0)
Lymphocytes Relative: 35 % (ref 12–46)
MCH: 30.1 pg (ref 26.0–34.0)
MCHC: 33.2 g/dL (ref 30.0–36.0)
MCV: 90.8 fL (ref 78.0–100.0)
MONO ABS: 0.5 10*3/uL (ref 0.1–1.0)
Monocytes Relative: 6 % (ref 3–12)
NEUTROS ABS: 4.2 10*3/uL (ref 1.7–7.7)
Neutrophils Relative %: 55 % (ref 43–77)
PLATELETS: 242 10*3/uL (ref 150–400)
RBC: 4.65 MIL/uL (ref 3.87–5.11)
RDW: 13.3 % (ref 11.5–15.5)
WBC: 7.7 10*3/uL (ref 4.0–10.5)

## 2014-02-28 LAB — BASIC METABOLIC PANEL
Anion gap: 4 — ABNORMAL LOW (ref 5–15)
BUN: 18 mg/dL (ref 6–23)
CALCIUM: 9 mg/dL (ref 8.4–10.5)
CO2: 27 mmol/L (ref 19–32)
CREATININE: 0.89 mg/dL (ref 0.50–1.10)
Chloride: 106 mmol/L (ref 96–112)
GFR, EST NON AFRICAN AMERICAN: 80 mL/min — AB (ref >90)
Glucose, Bld: 97 mg/dL (ref 70–99)
Potassium: 3.6 mmol/L (ref 3.5–5.1)
Sodium: 137 mmol/L (ref 135–145)

## 2014-02-28 LAB — D-DIMER, QUANTITATIVE (NOT AT ARMC): D DIMER QUANT: 0.84 ug{FEU}/mL — AB (ref 0.00–0.48)

## 2014-02-28 LAB — TROPONIN I

## 2014-02-28 MED ORDER — IOHEXOL 350 MG/ML SOLN
100.0000 mL | Freq: Once | INTRAVENOUS | Status: AC | PRN
Start: 2014-02-28 — End: 2014-02-28
  Administered 2014-02-28: 100 mL via INTRAVENOUS

## 2014-02-28 NOTE — ED Provider Notes (Signed)
CSN: [id number]     Arrival date & time 02/28/14  6269 History   None    Chief Complaint  Patient presents with  . Chest Pain     (Consider location/radiation/quality/duration/timing/severity/associated sxs/prior Treatment) HPI Comments: Patient presents to the ER for evaluation of intermittent chest pain. She reports that symptoms began last night. She had a pain in the chest but then became a numbness in the left arm. It lasted approximately 2 hours and then resolved. Patient reports she had another episode this morning of a heaviness in her chest that lasted approximately 5 minutes. She has been having some intermittent symptoms today, none currently. No shortness of breath, nausea, vomiting, diaphoresis.  She denies history of hypertension, high cholesterol, smoking, diabetes, family history of heart disease.  Patient is a 39 y.o. female presenting with chest pain.  Chest Pain   Past Medical History  Diagnosis Date  . Edema   . Breast mass in female     benign  . Asthma   . ADHD (attention deficit hyperactivity disorder)   . Anemia   . Obese   . Varicosities   . Headache(784.0)   . AMA (advanced maternal age) multigravida 35+    Past Surgical History  Procedure Laterality Date  . Cholecystectomy    . Closed reduction shoulder dislocation      left  . Colposcopy    . Tubal ligation  01/22/2011    Procedure: POST PARTUM TUBAL LIGATION;  Surgeon: Eldred Manges, MD;  Location: [facility];  Service: Gynecology;  Laterality: Bilateral;  post partum tubal ligation bilateral   Family History  Problem Relation Age of Onset  . Other      non contributory  . Cancer Maternal Grandmother     throat  . Vision loss Cousin    History  Substance Use Topics  . Smoking status: Never Smoker   . Smokeless tobacco: Never Used  . Alcohol Use: No   OB History    Gravida Para Term Preterm AB TAB SAB Ectopic Multiple Living   4 3 3  0 1 0 1 0 0 3     Review of Systems   Cardiovascular: Positive for chest pain.  All other systems reviewed and are negative.     Allergies  Review of patient's allergies indicates no known allergies.  Home Medications   Prior to Admission medications   Medication Sig Start Date End Date Taking? Authorizing Provider  amphetamine-dextroamphetamine (ADDERALL XR) 30 MG 24 hr capsule Take 1 capsule (30 mg total) by mouth every morning. 02/21/14   Midge Minium, MD  amphetamine-dextroamphetamine (ADDERALL) 20 MG tablet Take 1 tablet (20 mg total) by mouth daily. 02/21/14   Midge Minium, MD  ibuprofen (ADVIL,MOTRIN) 600 MG tablet Take 600 mg by mouth every 6 (six) hours as needed. Takes for pain    Historical Provider, MD   There were no vitals taken for this visit. Physical Exam  Constitutional: She is oriented to person, place, and time. She appears well-developed and well-nourished. No distress.  HENT:  Head: Normocephalic and atraumatic.  Right Ear: Hearing normal.  Left Ear: Hearing normal.  Nose: Nose normal.  Mouth/Throat: Oropharynx is clear and moist and mucous membranes are normal.  Eyes: Conjunctivae and EOM are normal. Pupils are equal, round, and reactive to light.  Neck: Normal range of motion. Neck supple.  Cardiovascular: Regular rhythm, S1 normal and S2 normal.  Exam reveals no gallop and no friction rub.   No  murmur heard. Pulmonary/Chest: Effort normal and breath sounds normal. No respiratory distress. She exhibits no tenderness.  Abdominal: Soft. Normal appearance and bowel sounds are normal. There is no hepatosplenomegaly. There is no tenderness. There is no rebound, no guarding, no tenderness at McBurney's point and negative Murphy's sign. No hernia.  Musculoskeletal: Normal range of motion.  Neurological: She is alert and oriented to person, place, and time. She has normal strength. No cranial nerve deficit or sensory deficit. Coordination normal. GCS eye subscore is 4. GCS verbal subscore is 5.  GCS motor subscore is 6.  Skin: Skin is warm, dry and intact. No rash noted. No cyanosis.  Psychiatric: Her speech is normal and behavior is normal. Thought content normal. Her mood appears anxious.  Nursing note and vitals reviewed.   ED Course  Procedures (including critical care time) Labs Review Labs Reviewed  CBC WITH DIFFERENTIAL/PLATELET  BASIC METABOLIC PANEL  TROPONIN I  D-DIMER, QUANTITATIVE    Imaging Review No results found.   EKG Interpretation   Date/Time:  Friday February 28 2014 09:38:58 EST Ventricular Rate:  98 PR Interval:  124 QRS Duration: 92 QT Interval:  346 QTC Calculation: 441 R Axis:   86 Text Interpretation:  Normal sinus rhythm Normal ECG Confirmed by POLLINA   MD, CHRISTOPHER ([id number]) on 02/28/2014 9:41:05 AM      MDM   Final diagnoses:  Chest pain    Patient presents to the ER for evaluation of chest pain. Pain is atypical. Pain has been intermittent and occurs at rest. She has not had any exertional component. Patient has no known cardiac risk factors other than weight. Heart score is essentially 0.  Was mildly tachycardic at arrival as well as hypertensive. It is felt that this is secondary to anxiety. She did, however have a slightly elevated d-dimer. She therefore underwent CT angiography which was normal. Patient is feeling better, asymptomatic. She will therefore be discharged, referred back to her primary care doctor for recheck. Return if her symptoms worsen.    Orpah Greek, MD 02/28/14 1158

## 2014-02-28 NOTE — Telephone Encounter (Signed)
Patient disconnected the phone call before I could get in touch with [facility]. Please call patient. Thanks!

## 2014-02-28 NOTE — Discharge Instructions (Signed)

## 2014-02-28 NOTE — Telephone Encounter (Signed)
Called patient on both numbers listed with no answer.  Per chart, patient has gone to ED.

## 2014-02-28 NOTE — Telephone Encounter (Signed)
Patient called in stating that she has chest pain. Call transferred to [facility].

## 2014-02-28 NOTE — ED Notes (Signed)
Patient states she was shopping last night around 8 pm, when she developed a sudden onset of left arm pain which became numb.  States the numbness lasted for several hours, then resolved.  States this morning ar 0230 am, she was sleeping and was awakened with heaviness and pressure in the center of her chest, which lasted for approximately 5 minutes.  Denies sob or sweating.

## 2014-04-07 ENCOUNTER — Telehealth: Payer: Self-pay | Admitting: Family Medicine

## 2014-04-07 MED ORDER — AMPHETAMINE-DEXTROAMPHETAMINE 20 MG PO TABS: 20.0000 mg | ORAL_TABLET | Freq: Every day | ORAL | Status: DC

## 2014-04-07 MED ORDER — AMPHETAMINE-DEXTROAMPHET ER 30 MG PO CP24: 30.0000 mg | ORAL_CAPSULE | ORAL | Status: DC

## 2014-04-07 NOTE — Telephone Encounter (Signed)
Med filled per verbal from provider. Pt will be notified when available for pick up.

## 2014-04-07 NOTE — Telephone Encounter (Signed)
adderal xr 30 mg and adderal 20mg 

## 2014-04-09 NOTE — Telephone Encounter (Signed)
Checking status of med refill

## 2014-04-10 ENCOUNTER — Other Ambulatory Visit: Payer: Self-pay | Admitting: General Practice

## 2014-04-10 ENCOUNTER — Emergency Department (HOSPITAL_COMMUNITY)
Admission: EM | Admit: 2014-04-10 | Discharge: 2014-04-10 | Disposition: A | Discharge status: Home / Self Care | Payer: Commercial Managed Care - PPO | Attending: Emergency Medicine | Admitting: Emergency Medicine

## 2014-04-10 ENCOUNTER — Encounter (HOSPITAL_COMMUNITY): Payer: Self-pay | Admitting: Emergency Medicine

## 2014-04-10 DIAGNOSIS — Z862 Personal history of diseases of the blood and blood-forming organs and certain disorders involving the immune mechanism: Secondary | ICD-10-CM | POA: Insufficient documentation

## 2014-04-10 DIAGNOSIS — M25551 Pain in right hip: Secondary | ICD-10-CM | POA: Diagnosis present

## 2014-04-10 DIAGNOSIS — Z8679 Personal history of other diseases of the circulatory system: Secondary | ICD-10-CM | POA: Diagnosis not present

## 2014-04-10 DIAGNOSIS — E669 Obesity, unspecified: Secondary | ICD-10-CM | POA: Insufficient documentation

## 2014-04-10 DIAGNOSIS — J45909 Unspecified asthma, uncomplicated: Secondary | ICD-10-CM | POA: Diagnosis not present

## 2014-04-10 DIAGNOSIS — Z87828 Personal history of other (healed) physical injury and trauma: Secondary | ICD-10-CM | POA: Insufficient documentation

## 2014-04-10 DIAGNOSIS — M5431 Sciatica, right side: Secondary | ICD-10-CM | POA: Diagnosis not present

## 2014-04-10 DIAGNOSIS — Z79899 Other long term (current) drug therapy: Secondary | ICD-10-CM | POA: Diagnosis not present

## 2014-04-10 DIAGNOSIS — Z8742 Personal history of other diseases of the female genital tract: Secondary | ICD-10-CM | POA: Diagnosis not present

## 2014-04-10 DIAGNOSIS — F909 Attention-deficit hyperactivity disorder, unspecified type: Secondary | ICD-10-CM | POA: Diagnosis not present

## 2014-04-10 MED ORDER — PREDNISONE 20 MG PO TABS
60.0000 mg | ORAL_TABLET | Freq: Once | ORAL | Status: AC
  Administered 2014-04-10: 60 mg via ORAL
  Filled 2014-04-10: qty 3

## 2014-04-10 MED ORDER — AMPHETAMINE-DEXTROAMPHET ER 30 MG PO CP24: 30.0000 mg | ORAL_CAPSULE | ORAL | Status: DC

## 2014-04-10 MED ORDER — CYCLOBENZAPRINE HCL 5 MG PO TABS: 5.0000 mg | ORAL_TABLET | Freq: Three times a day (TID) | ORAL | Status: DC

## 2014-04-10 MED ORDER — CYCLOBENZAPRINE HCL 10 MG PO TABS
5.0000 mg | ORAL_TABLET | Freq: Once | ORAL | Status: DC
  Filled 2014-04-10: qty 1

## 2014-04-10 MED ORDER — AMPHETAMINE-DEXTROAMPHETAMINE 20 MG PO TABS: 20.0000 mg | ORAL_TABLET | Freq: Every day | ORAL | Status: DC

## 2014-04-10 MED ORDER — PREDNISONE 20 MG PO TABS: ORAL_TABLET | ORAL | Status: DC

## 2014-04-10 NOTE — Discharge Instructions (Signed)
The medication as directed until all up been completed.  Please make an appointment with your primary care physician to be seen in a week to two.  You have also been given some exercises that you can start in the next several days, remember our discussion about positioning, especially when sleeping.  Try to avoid sitting for long periods of time.

## 2014-04-10 NOTE — ED Provider Notes (Signed)
CSN: [id number]     Arrival date & time 04/10/14  1044 History   First MD Initiated Contact with Patient 04/10/14 1047     Chief Complaint  Patient presents with  . Hip Pain     (Consider location/radiation/quality/duration/timing/severity/associated sxs/prior Treatment) HPI  Past Medical History  Diagnosis Date  . Edema   . Breast mass in female     benign  . Asthma   . ADHD (attention deficit hyperactivity disorder)   . Anemia   . Obese   . Varicosities   . Headache(784.0)   . AMA (advanced maternal age) multigravida 35+    Past Surgical History  Procedure Laterality Date  . Cholecystectomy    . Closed reduction shoulder dislocation      left  . Colposcopy    . Tubal ligation  01/22/2011    Procedure: POST PARTUM TUBAL LIGATION;  Surgeon: Eldred Manges, MD;  Location: [facility];  Service: Gynecology;  Laterality: Bilateral;  post partum tubal ligation bilateral   Family History  Problem Relation Age of Onset  . Other      non contributory  . Cancer Maternal Grandmother     throat  . Vision loss Cousin    History  Substance Use Topics  . Smoking status: Never Smoker   . Smokeless tobacco: Never Used  . Alcohol Use: No   OB History    Gravida Para Term Preterm AB TAB SAB Ectopic Multiple Living   4 3 3  0 1 0 1 0 0 3     Review of Systems    Allergies  Review of patient's allergies indicates no known allergies.  Home Medications   Prior to Admission medications   Medication Sig Start Date End Date Taking? Authorizing Provider  amphetamine-dextroamphetamine (ADDERALL XR) 30 MG 24 hr capsule Take 1 capsule (30 mg total) by mouth every morning. 04/07/14   Midge Minium, MD  amphetamine-dextroamphetamine (ADDERALL) 20 MG tablet Take 1 tablet (20 mg total) by mouth daily. 04/07/14   Midge Minium, MD  cyclobenzaprine (FLEXERIL) 5 MG tablet Take 1 tablet (5 mg total) by mouth 3 (three) times daily. 04/10/14   Junius Creamer, NP  ibuprofen (ADVIL,MOTRIN)  600 MG tablet Take 600 mg by mouth every 6 (six) hours as needed. Takes for pain    Historical Provider, MD  predniSONE (DELTASONE) 20 MG tablet 3 Tabs PO Days 1-3, then 2 tabs PO Days 4-6, then 1 tab PO Day 7-9, then Half Tab PO Day 10-12 04/10/14   Junius Creamer, NP   BP 139/89 mmHg  Pulse 101  Temp(Src) 98.3 F (36.8 C) (Oral)  Resp 20  Ht 5\' 5"  (1.651 m)  Wt 205 lb (92.987 kg)  BMI 34.11 kg/m2  SpO2 100%  LMP 02/27/2014 Physical Exam  Constitutional: She appears well-developed and well-nourished.  HENT:  Head: Normocephalic.  Eyes: Pupils are equal, round, and reactive to light.  Neck: Normal range of motion.  Cardiovascular: Normal rate.   Pulmonary/Chest: Effort normal.  Abdominal: Soft.  Musculoskeletal: Normal range of motion. She exhibits no edema or tenderness.       Back:  Neurological: She is alert.  Skin: Skin is warm.  Nursing note and vitals reviewed.   ED Course  Procedures (including critical care time) Labs Review Labs Reviewed - No data to display  Imaging Review No results found.   EKG Interpretation None      MDM   Final diagnoses:  Sciatic pain, right  Junius Creamer, NP 04/10/14 [place], MD 04/11/14 [id number]
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
## 2014-04-10 NOTE — Telephone Encounter (Signed)
Pt notified on 3/21 ok for pick up. Pt came in on 04/10/14 to pick up medication and it was not at the front desk, pt rx printed again today and Dr. Charlett Blake signed.

## 2014-05-07 ENCOUNTER — Encounter: Payer: Self-pay | Admitting: Medical

## 2014-05-07 ENCOUNTER — Ambulatory Visit (INDEPENDENT_AMBULATORY_CARE_PROVIDER_SITE_OTHER): Payer: Commercial Managed Care - PPO | Admitting: Medical

## 2014-05-07 VITALS — BP 147/99 | HR 101 | Temp 98.3°F | Ht 65.25 in | Wt 215.4 lb

## 2014-05-07 DIAGNOSIS — K219 Gastro-esophageal reflux disease without esophagitis: Secondary | ICD-10-CM | POA: Insufficient documentation

## 2014-05-07 DIAGNOSIS — M5441 Lumbago with sciatica, right side: Secondary | ICD-10-CM | POA: Diagnosis not present

## 2014-05-07 DIAGNOSIS — M549 Dorsalgia, unspecified: Secondary | ICD-10-CM | POA: Insufficient documentation

## 2014-05-07 MED ORDER — OMEPRAZOLE 40 MG PO CPDR: 40.0000 mg | DELAYED_RELEASE_CAPSULE | Freq: Every day | ORAL | Status: DC

## 2014-05-07 MED ORDER — TIZANIDINE HCL 4 MG PO TABS: 4.0000 mg | ORAL_TABLET | Freq: Three times a day (TID) | ORAL | Status: DC | PRN

## 2014-05-07 NOTE — Assessment & Plan Note (Signed)
Recent flare of symptoms with nsaid. Rx omeprazole. Then titrate down on dose of ibuprofen to level that does not cause symptoms. Even if as low as 200 mg. If abdomen pain increase stop nsaids all together and notify us.

## 2014-05-07 NOTE — Progress Notes (Signed)
Pre visit review using our clinic review tool, if applicable. No additional management support is needed unless otherwise documented below in the visit note. 

## 2014-05-07 NOTE — Assessment & Plan Note (Addendum)
/  sciatica features rt side. Get lumbar spine xray. Rx zanaflex for muscle relaxant. Use ibuprofen and refer to PT since now 1 month with symptoms.

## 2014-05-07 NOTE — Progress Notes (Signed)
Subjective:    Patient ID: Ana Gray, female    DOB: 17-Mar-1975, 39 y.o.   MRN: [id number]  HPI   Pt in with some back pain. She thinks pain is sciatica type pain. Pt went to ED end of march. She got prednisone and flexeril. Pt could not take flexeril at work so only took at night. Pt states pain controlled when on medication. But when med ran out would have symptoms. After prednisone she took ibuprofen but it caused reflux. It did control the pain but pain would return after about 8 hour.   Pt currently on ibuprofen 800 mg every 8 hours.   Symptoms almost a month now.   LMP- April 18, 2014.   Some heartburn flared with ibuprofen.    Review of Systems  Constitutional: Negative for fever, chills and fatigue.  Respiratory: Negative for cough, chest tightness, shortness of breath and wheezing.   Cardiovascular: Negative for chest pain and palpitations.  Gastrointestinal: Negative for nausea, vomiting and abdominal pain.  Genitourinary: Negative for dysuria, urgency, hematuria and flank pain.  Musculoskeletal: Positive for back pain.       Rt si area.  Neurological: Negative for dizziness, syncope, facial asymmetry, speech difficulty, weakness, numbness and headaches.  Hematological: Negative for adenopathy. Does not bruise/bleed easily.   Past Medical History  Diagnosis Date  . Edema   . Breast mass in female     benign  . Asthma   . ADHD (attention deficit hyperactivity disorder)   . Anemia   . Obese   . Varicosities   . Headache(784.0)   . AMA (advanced maternal age) multigravida 2+     History   Social History  . Marital Status: Single    Spouse Name: N/A  . Number of Children: N/A  . Years of Education: N/A   Occupational History  . Not on file.   Social History Main Topics  . Smoking status: Never Smoker   . Smokeless tobacco: Never Used  . Alcohol Use: No  . Drug Use: No  . Sexual Activity: Yes     Comment: tubal   Other Topics Concern  .  Not on file   Social History Narrative    Past Surgical History  Procedure Laterality Date  . Cholecystectomy    . Closed reduction shoulder dislocation      left  . Colposcopy    . Tubal ligation  01/22/2011    Procedure: POST PARTUM TUBAL LIGATION;  Surgeon: Eldred Manges, MD;  Location: [facility];  Service: Gynecology;  Laterality: Bilateral;  post partum tubal ligation bilateral    Family History  Problem Relation Age of Onset  . Other      non contributory  . Cancer Maternal Grandmother     throat  . Vision loss Cousin     No Known Allergies  Current Outpatient Prescriptions on File Prior to Visit  Medication Sig Dispense Refill  . amphetamine-dextroamphetamine (ADDERALL XR) 30 MG 24 hr capsule Take 1 capsule (30 mg total) by mouth every morning. 30 capsule 0  . amphetamine-dextroamphetamine (ADDERALL) 20 MG tablet Take 1 tablet (20 mg total) by mouth daily. 30 tablet 0  . cyclobenzaprine (FLEXERIL) 5 MG tablet Take 1 tablet (5 mg total) by mouth 3 (three) times daily. 30 tablet 0  . ibuprofen (ADVIL,MOTRIN) 600 MG tablet Take 600 mg by mouth every 6 (six) hours as needed. Takes for pain     No current facility-administered medications on file prior to  visit.    BP 147/99 mmHg  Pulse 101  Temp(Src) 98.3 F (36.8 C) (Oral)  Ht 5' 5.25" (1.657 m)  Wt 215 lb 6.4 oz (97.705 kg)  BMI 35.59 kg/m2  SpO2 100%  LMP 04/18/2014      Objective:   Physical Exam  General Appearance- Not in acute distress.    Chest and Lung Exam Auscultation: Breath sounds:-Normal. Clear even and unlabored. Adventitious sounds:- No Adventitious sounds.  Cardiovascular Auscultation:Rythm - Regular, rate and rythm. Heart Sounds -Normal heart sounds.  Abdomen Inspection:-Inspection Normal.  Palpation/Perucssion: Palpation and Percussion of the abdomen reveal- Non Tender, No Rebound tenderness, No rigidity(Guarding) and No Palpable abdominal masses.  Liver:-Normal.  Spleen:-  Normal.   Back Faint  lumbar spine tenderness to palpation.(pain more direct rt si area on palpaiton) Pain on straight leg lift. Pain on lateral movements and flexion/extension of the spine.  Lower ext neurologic  L5-S1 sensation intact bilaterally. Normal patellar reflexes bilaterally. No foot drop bilaterally.      Assessment & Plan:

## 2014-05-07 NOTE — Patient Instructions (Addendum)
Back pain /sciatica features rt side. Get lumbar spine xray. Rx zanaflex for muscle relaxant. Use ibuprofen and refer to PT since now 1 month with symptoms.   GERD (gastroesophageal reflux disease) Recent flare of symptoms with nsaid. Rx omeprazole. Then titrate down on dose of ibuprofen to level that does not cause symptoms. Even if as low as 200 mg. If abdomen pain increase stop nsaids all together and notify us.    Follow up in 2 wks or as needed.

## 2014-05-21 ENCOUNTER — Telehealth: Payer: Self-pay | Admitting: Family Medicine

## 2014-05-21 MED ORDER — AMPHETAMINE-DEXTROAMPHET ER 30 MG PO CP24: 30.0000 mg | ORAL_CAPSULE | ORAL | Status: DC

## 2014-05-21 MED ORDER — AMPHETAMINE-DEXTROAMPHETAMINE 20 MG PO TABS: 20.0000 mg | ORAL_TABLET | Freq: Every day | ORAL | Status: DC

## 2014-05-21 NOTE — Telephone Encounter (Signed)
Med filled and pt informed ready for pick up at front desk.

## 2014-05-21 NOTE — Telephone Encounter (Signed)
Relation to pt: self Call back number: [phone]   Reason for call:  Pt requesting a refill amphetamine-dextroamphetamine (ADDERALL XR) 30 MG 24 hr capsule and amphetamine-dextroamphetamine (ADDERALL) 20 MG tablet

## 2014-05-21 NOTE — Telephone Encounter (Signed)
Ok for #30 of each 

## 2014-05-21 NOTE — Telephone Encounter (Signed)
Last OV 10/13/13 adderall 30 last filled 03/21/14 #30 with 0 adderall 20 last filled 03/21/14 #30 with 0

## 2014-07-04 ENCOUNTER — Telehealth: Payer: Self-pay | Admitting: Family Medicine

## 2014-07-04 MED ORDER — AMPHETAMINE-DEXTROAMPHET ER 30 MG PO CP24: 30.0000 mg | ORAL_CAPSULE | ORAL | Status: DC

## 2014-07-04 MED ORDER — AMPHETAMINE-DEXTROAMPHETAMINE 20 MG PO TABS: 20.0000 mg | ORAL_TABLET | Freq: Every day | ORAL | Status: DC

## 2014-07-04 NOTE — Telephone Encounter (Signed)
Medications refilled. UDS at pickup.

## 2014-07-04 NOTE — Telephone Encounter (Signed)
Pt.notified

## 2014-07-04 NOTE — Telephone Encounter (Signed)
Last OV 10-03-13 (CPE) adderall XR 30mg  last filled 05-21-14 #30 with 0 adderall 20 mg last filled 05-21-14 #30 with 0  Low risk, due for UDS

## 2014-07-04 NOTE — Telephone Encounter (Signed)
Caller name: Ki Relationship to patient: self Can be reached: [phone]  Reason for call: Pt calling for refills of Adderall 30mg  24 hr & Adderall 20mg . She takes each 1/day. She is out of medication. She has not had for several days. Please call when ready for p/u.

## 2014-08-07 ENCOUNTER — Telehealth: Payer: Self-pay | Admitting: Family Medicine

## 2014-08-07 MED ORDER — AMPHETAMINE-DEXTROAMPHETAMINE 20 MG PO TABS: 20.0000 mg | ORAL_TABLET | Freq: Every day | ORAL | Status: DC

## 2014-08-07 MED ORDER — AMPHETAMINE-DEXTROAMPHET ER 30 MG PO CP24: 30.0000 mg | ORAL_CAPSULE | ORAL | Status: DC

## 2014-08-07 NOTE — Telephone Encounter (Signed)
last OV 10/06/13 adderall last filled 07/04/14 #30 with 0 on both

## 2014-08-07 NOTE — Telephone Encounter (Signed)
Med filled and pt notified.  

## 2014-08-07 NOTE — Telephone Encounter (Signed)
Caller name:Shaniya Relationship to patient:self Can be reached [phone] Pharmacy:  Reason for call:needs written rx for adderall 30 mg xr adderall 20 mg

## 2014-08-07 NOTE — Telephone Encounter (Signed)
[place] for refill on both but please remind pt she needs to schedule her CPE so she can continue to get medicationty

## 2014-09-26 ENCOUNTER — Other Ambulatory Visit: Payer: Self-pay | Admitting: Family Medicine

## 2014-09-26 MED ORDER — AMPHETAMINE-DEXTROAMPHET ER 30 MG PO CP24: 30.0000 mg | ORAL_CAPSULE | ORAL | Status: DC

## 2014-09-26 MED ORDER — AMPHETAMINE-DEXTROAMPHETAMINE 20 MG PO TABS: 20.0000 mg | ORAL_TABLET | Freq: Every day | ORAL | Status: DC

## 2014-09-26 NOTE — Telephone Encounter (Signed)
Pt informed medications are at front desk for pick up.

## 2014-09-26 NOTE — Telephone Encounter (Signed)
Last OV 10/03/13 Both adderalls last filled 08/07/14 #30 with 0

## 2014-09-26 NOTE — Telephone Encounter (Signed)
Pt needing refill on adderall 30mg  xr and adderall 20mg . Takes each 1/day. Has only 1 of each left. Please call her when RX ready for pick up. [phone]. Pt is scheduled for cpe 10/28/14 with Melissa to change providers and stay with [facility] office.

## 2014-10-28 ENCOUNTER — Encounter: Payer: Self-pay | Admitting: Family

## 2014-10-28 ENCOUNTER — Ambulatory Visit (INDEPENDENT_AMBULATORY_CARE_PROVIDER_SITE_OTHER): Payer: Commercial Managed Care - PPO | Admitting: Family

## 2014-10-28 VITALS — BP 134/90 | HR 81 | Temp 99.0°F | Ht 65.0 in | Wt 210.6 lb

## 2014-10-28 DIAGNOSIS — Z0000 Encounter for general adult medical examination without abnormal findings: Secondary | ICD-10-CM | POA: Diagnosis not present

## 2014-10-28 DIAGNOSIS — R202 Paresthesia of skin: Secondary | ICD-10-CM

## 2014-10-28 DIAGNOSIS — Z23 Encounter for immunization: Secondary | ICD-10-CM

## 2014-10-28 DIAGNOSIS — F909 Attention-deficit hyperactivity disorder, unspecified type: Secondary | ICD-10-CM

## 2014-10-28 DIAGNOSIS — F988 Other specified behavioral and emotional disorders with onset usually occurring in childhood and adolescence: Secondary | ICD-10-CM

## 2014-10-28 LAB — URINALYSIS, ROUTINE W REFLEX MICROSCOPIC
Bilirubin Urine: NEGATIVE
Hgb urine dipstick: NEGATIVE
Ketones, ur: NEGATIVE
NITRITE: NEGATIVE
SPECIFIC GRAVITY, URINE: 1.01 (ref 1.000–1.030)
Total Protein, Urine: NEGATIVE
Urine Glucose: NEGATIVE
Urobilinogen, UA: 0.2 (ref 0.0–1.0)
pH: 7 (ref 5.0–8.0)

## 2014-10-28 LAB — HEPATIC FUNCTION PANEL
ALK PHOS: 55 U/L (ref 39–117)
ALT: 9 U/L (ref 0–35)
AST: 13 U/L (ref 0–37)
Albumin: 4 g/dL (ref 3.5–5.2)
BILIRUBIN TOTAL: 0.7 mg/dL (ref 0.2–1.2)
Bilirubin, Direct: 0.1 mg/dL (ref 0.0–0.3)
Total Protein: 7.5 g/dL (ref 6.0–8.3)

## 2014-10-28 LAB — CBC WITH DIFFERENTIAL/PLATELET
BASOS ABS: 0.1 10*3/uL (ref 0.0–0.1)
Basophils Relative: 0.7 % (ref 0.0–3.0)
Eosinophils Absolute: 0.1 10*3/uL (ref 0.0–0.7)
Eosinophils Relative: 1.4 % (ref 0.0–5.0)
HCT: 39.5 % (ref 36.0–46.0)
Hemoglobin: 13.2 g/dL (ref 12.0–15.0)
LYMPHS ABS: 2.3 10*3/uL (ref 0.7–4.0)
Lymphocytes Relative: 29.5 % (ref 12.0–46.0)
MCHC: 33.4 g/dL (ref 30.0–36.0)
MCV: 91.3 fl (ref 78.0–100.0)
MONO ABS: 0.4 10*3/uL (ref 0.1–1.0)
MONOS PCT: 4.6 % (ref 3.0–12.0)
NEUTROS PCT: 63.8 % (ref 43.0–77.0)
Neutro Abs: 5 10*3/uL (ref 1.4–7.7)
Platelets: 197 10*3/uL (ref 150.0–400.0)
RBC: 4.33 Mil/uL (ref 3.87–5.11)
RDW: 14 % (ref 11.5–15.5)
WBC: 7.9 10*3/uL (ref 4.0–10.5)

## 2014-10-28 LAB — LIPID PANEL
CHOLESTEROL: 151 mg/dL (ref 0–200)
HDL: 44.7 mg/dL (ref >39.00)
LDL Cholesterol: 95 mg/dL (ref 0–99)
NonHDL: 105.91
Total CHOL/HDL Ratio: 3
Triglycerides: 57 mg/dL (ref 0.0–149.0)
VLDL: 11.4 mg/dL (ref 0.0–40.0)

## 2014-10-28 LAB — BASIC METABOLIC PANEL
BUN: 9 mg/dL (ref 6–23)
CALCIUM: 9.4 mg/dL (ref 8.4–10.5)
CO2: 30 mEq/L (ref 19–32)
Chloride: 106 mEq/L (ref 96–112)
Creatinine, Ser: 0.81 mg/dL (ref 0.40–1.20)
GFR: 82.86 mL/min (ref >60.00)
GLUCOSE: 94 mg/dL (ref 70–99)
Potassium: 3.6 mEq/L (ref 3.5–5.1)
Sodium: 140 mEq/L (ref 135–145)

## 2014-10-28 LAB — FOLATE: Folate: 15 ng/mL (ref >5.9)

## 2014-10-28 LAB — TSH: TSH: 2.91 u[IU]/mL (ref 0.35–4.50)

## 2014-10-28 LAB — VITAMIN B12: Vitamin B-12: 251 pg/mL (ref 211–911)

## 2014-10-28 MED ORDER — ATOMOXETINE HCL 40 MG PO CAPS
ORAL_CAPSULE | ORAL | Status: DC
Start: 2014-10-28 — End: 2014-10-29

## 2014-10-28 NOTE — Assessment & Plan Note (Signed)
Uncontrolled.  I am concerned about her home BP readings. Office readings have been borderline as well.  D/C adderall, trial of strattera   BP Readings from Last 3 Encounters:  10/28/14 134/90  05/07/14 147/99  04/10/14 139/89

## 2014-10-28 NOTE — Assessment & Plan Note (Signed)
We discussed healthy diet, exercise, weight loss. Flu shot today. Pt will schedule her mammogram at the breast center in December.  Pap is up to date.

## 2014-10-28 NOTE — Patient Instructions (Addendum)
Stop adderall, start strattera.  Please complete lab work prior to leaving. Follow up in 6 weeks.

## 2014-10-28 NOTE — Progress Notes (Signed)
Subjective:    Patient ID: Ana Gray, female    DOB: 1975/03/22, 39 y.o.   MRN: [id number]  HPI  Patient presents today for complete physical.  Immunizations: Tetanus up to date, would like a flu shot.   Diet: reports that her diet is healthy Exercise: not exercising recently Pap Smear: 10/15- GYN (Dr. Cletis Media) Mammogram: 12/15  Wt Readings from Last 3 Encounters:  10/28/14 210 lb 9.6 oz (95.528 kg)  05/07/14 215 lb 6.4 oz (97.705 kg)  04/10/14 205 lb (92.987 kg)   She reports max weight was 260 following a bout with post partum depression after the birth of her 50 year old daughter.  Feels like she can't lose any more weight.   ADHD- Reports that for the past 6 months, when she takes the second afternoon dose of adderall  her BP "goes up."  Last time she checked BP was 962 systolic. She reports that she stopped taking the afternoon dose and BP improves. Unfortunately, her AM dose only helps her for a few hours and then it "wears off."    Review of Systems  Constitutional: Negative for unexpected weight change.  HENT: Negative for hearing loss and rhinorrhea.   Eyes: Negative for visual disturbance.  Respiratory: Negative for cough.   Cardiovascular: Negative for chest pain.  Gastrointestinal: Negative for diarrhea and constipation.  Genitourinary: Negative for dysuria and frequency.  Musculoskeletal: Negative for myalgias and arthralgias.  Skin: Negative for rash.  Neurological:       Occasional migraines. Reports tingling on the bottom of her feet  Hematological: Negative for adenopathy.  Psychiatric/Behavioral:       Denies depression/anxiety       Past Medical History  Diagnosis Date  . Edema   . Breast mass in female     benign  . Asthma   . ADHD (attention deficit hyperactivity disorder)   . Anemia   . Obese   . Varicosities   . Headache(784.0)   . AMA (advanced maternal age) multigravida 41+     Social History   Social History  . Marital  Status: Single    Spouse Name: N/A  . Number of Children: N/A  . Years of Education: N/A   Occupational History  . Not on file.   Social History Main Topics  . Smoking status: Never Smoker   . Smokeless tobacco: Never Used  . Alcohol Use: No  . Drug Use: No  . Sexual Activity: Yes     Comment: tubal   Other Topics Concern  . Not on file   Social History Narrative   Works as a Museum/gallery curator at [employer]   3 children 1999 (daughter), 77 (daughter), 2013 (daughter)   Single   Completed some college   Grew up in [place]       Past Surgical History  Procedure Laterality Date  . Cholecystectomy    . Closed reduction shoulder dislocation      left  . Colposcopy    . Tubal ligation  01/22/2011    Procedure: POST PARTUM TUBAL LIGATION;  Surgeon: Eldred Manges, MD;  Location: [facility];  Service: Gynecology;  Laterality: Bilateral;  post partum tubal ligation bilateral    Family History  Problem Relation Age of Onset  . Other      non contributory  . Cancer Maternal Grandmother     throat  . Vision loss Cousin     No Known Allergies  Current Outpatient Prescriptions on File Prior to  Visit  Medication Sig Dispense Refill  . ibuprofen (ADVIL,MOTRIN) 600 MG tablet Take 600 mg by mouth every 6 (six) hours as needed. Takes for pain    . omeprazole (PRILOSEC) 40 MG capsule Take 1 capsule (40 mg total) by mouth daily. 30 capsule 3  . cyclobenzaprine (FLEXERIL) 5 MG tablet Take 1 tablet (5 mg total) by mouth 3 (three) times daily. (Patient not taking: Reported on 10/28/2014) 30 tablet 0  . tiZANidine (ZANAFLEX) 4 MG tablet Take 1 tablet (4 mg total) by mouth every 8 (eight) hours as needed for muscle spasms. (Patient not taking: Reported on 10/28/2014) 30 tablet 0   No current facility-administered medications on file prior to visit.    BP 134/90 mmHg  Pulse 81  Temp(Src) 99 F (37.2 C) (Oral)  Ht 5\' 5"  (1.651 m)  Wt 210 lb 9.6 oz (95.528 kg)  BMI 35.05 kg/m2  SpO2  100%  LMP 10/01/2013    Objective:   Physical Exam Physical Exam  Constitutional: She is oriented to person, place, and time. She appears well-developed and well-nourished. No distress.  HENT:  Head: Normocephalic and atraumatic.  Right Ear: Tympanic membrane and ear canal normal.  Left Ear: Tympanic membrane and ear canal normal.  Mouth/Throat: Oropharynx is clear and moist.  Eyes: Pupils are equal, round, and reactive to light. No scleral icterus.  Neck: Normal range of motion. No thyromegaly present.  Cardiovascular: Normal rate and regular rhythm.   No murmur heard. Pulmonary/Chest: Effort normal and breath sounds normal. No respiratory distress. He has no wheezes. She has no rales. She exhibits no tenderness.  Abdominal: Soft. Bowel sounds are normal. He exhibits no distension and no mass. There is no tenderness. There is no rebound and no guarding.  Musculoskeletal: She exhibits no edema.  Lymphadenopathy:    She has no cervical adenopathy.  Neurological: She is alert and oriented to person, place, and time. She has normal patellar reflexes. She exhibits normal muscle tone. Coordination normal.  Skin: Skin is warm and dry.  Psychiatric: She has a normal mood and affect. Her behavior is normal. Judgment and thought content normal.  Breasts: Examined lying Right: Without masses, retractions, discharge or axillary adenopathy.  Left: Without masses, retractions, discharge or axillary adenopathy.         Assessment & Plan:          Assessment & Plan:  Paresthesia- intermittent in feet. Will obtain b12 and folate.

## 2014-10-28 NOTE — Progress Notes (Signed)
Pre visit review using our clinic review tool, if applicable. No additional management support is needed unless otherwise documented below in the visit note. 

## 2014-10-29 ENCOUNTER — Telehealth: Payer: Self-pay | Admitting: Family

## 2014-10-29 MED ORDER — DEXMETHYLPHENIDATE HCL 2.5 MG PO TABS: 2.5000 mg | ORAL_TABLET | Freq: Two times a day (BID) | ORAL | Status: DC

## 2014-10-29 NOTE — Telephone Encounter (Signed)
Notified pt. Rx placed at front desk for pick up. Nurse visit scheduled for 11/12/14 at 4pm.

## 2014-10-29 NOTE — Telephone Encounter (Signed)
Caller name:[email or web address]  Relationship to patient:self Can be reached:([phone] Pharmacy:[facility]   Reason for call:the Ski[place] Cliche is too expensive  She needs something that has a generic

## 2014-10-29 NOTE — Telephone Encounter (Signed)
Can try focalin 2.5 mg bid, but needs nurse visit BP check in 2 weeks.

## 2014-10-30 ENCOUNTER — Telehealth: Payer: Self-pay | Admitting: Family

## 2014-10-30 DIAGNOSIS — E538 Deficiency of other specified B group vitamins: Secondary | ICD-10-CM

## 2014-10-30 MED ORDER — VITAMIN B-12 100 MCG PO TABS: 100.0000 ug | ORAL_TABLET | Freq: Every day | ORAL | Status: DC

## 2014-10-30 NOTE — Addendum Note (Signed)
Addended by: Rudene Anda on: 10/30/2014 04:19 PM   Modules accepted: Orders

## 2014-10-30 NOTE — Telephone Encounter (Signed)
Please call pt before 5pm @ [phone]

## 2014-10-30 NOTE — Telephone Encounter (Signed)
Pt notified and made aware. Provider's recommendation discussed.  Pt stated understanding and agreed to take b12 supplement.  She says she will call back to schedule lab appointment.  Future lab ordered.

## 2014-10-30 NOTE — Telephone Encounter (Signed)
b12 level is borderline low. I would recommend that she add an otc b12 supplement.  174mcg once daily and repeat b12 level in 3 months, dx b12 deficiency. Other labs look good.

## 2014-10-30 NOTE — Telephone Encounter (Signed)
Left message for call back.

## 2014-11-03 ENCOUNTER — Encounter: Payer: Self-pay | Admitting: Family

## 2014-11-03 MED ORDER — VITAMIN B-12 100 MCG PO TABS: 100.0000 ug | ORAL_TABLET | Freq: Every day | ORAL | Status: AC

## 2014-11-21 ENCOUNTER — Encounter: Payer: Self-pay | Admitting: Family

## 2014-11-21 MED ORDER — DEXMETHYLPHENIDATE HCL 5 MG PO TABS: 5.0000 mg | ORAL_TABLET | Freq: Two times a day (BID) | ORAL | Status: DC

## 2014-11-21 NOTE — Telephone Encounter (Signed)
Rx placed at front desk for pick up.  

## 2014-11-21 NOTE — Telephone Encounter (Signed)
Increase focalin to 5mg  BID, follow up in office in 2 weeks.

## 2014-11-21 NOTE — Telephone Encounter (Signed)
Notified pt and she voices understanding. States she already has follow up in a couple of weeks. Rx will be placed at front desk for pick up once NP signs Rx.

## 2014-11-24 ENCOUNTER — Encounter: Payer: Self-pay | Admitting: Family

## 2014-11-24 DIAGNOSIS — F909 Attention-deficit hyperactivity disorder, unspecified type: Secondary | ICD-10-CM

## 2014-12-05 ENCOUNTER — Telehealth: Payer: Self-pay | Admitting: Family

## 2014-12-05 NOTE — Telephone Encounter (Signed)
Caller name: Maxcine  Relationship to patient: Self   Can be reached: [phone]   Reason for call: Pt called in stating that on appt 10/11 at check she mentioned switching PCP from [place] to [facility], pt says that she now wants to stay with [facility].    Please advise.

## 2014-12-05 NOTE — Telephone Encounter (Signed)
Pt called in stating that the new medication Focalin isn't working for her ADHD,  pt would like to be advised further. Pt says that the dosage isn't enough or she needs to be switched back to adderall.     CB: [phone]

## 2014-12-05 NOTE — Telephone Encounter (Signed)
OK with me.

## 2014-12-05 NOTE — Telephone Encounter (Signed)
Pt needs to be aware that I am moving to [facility]

## 2014-12-05 NOTE — Telephone Encounter (Signed)
Pt is aware of the move to [place]. That was pt's initial concern but she states that she would like to follow [facility] anywhere she goes.  She would like to discuss her medication with Dr. Birdie Riddle as soon as possible.

## 2014-12-05 NOTE — Telephone Encounter (Signed)
Ok to reestablish with [facility] and ok for an appt, when we can place her.

## 2014-12-05 NOTE — Telephone Encounter (Signed)
Please advise 

## 2014-12-05 NOTE — Telephone Encounter (Signed)
I would advise that she arrange sooner follow up with Dr. Birdie Riddle to discuss medication.

## 2014-12-05 NOTE — Telephone Encounter (Signed)
Advised pt of below recommendation and she declines appointment and states that she is going to find another doctor's office.

## 2014-12-08 NOTE — Telephone Encounter (Signed)
Pt scheduled for 12/20 thru mychart.

## 2014-12-09 ENCOUNTER — Ambulatory Visit: Payer: Commercial Managed Care - PPO | Admitting: Family Medicine

## 2015-01-06 ENCOUNTER — Ambulatory Visit: Payer: Self-pay | Admitting: Family Medicine

## 2015-01-20 ENCOUNTER — Ambulatory Visit: Payer: Commercial Managed Care - PPO | Admitting: Family Medicine

## 2015-01-20 NOTE — Telephone Encounter (Signed)
Please advise, per note below pt was going to find a new provider office, that is usually grounds for dismissal. Pt is on [facility]'s schedule tomorrow. Can we verify she is going to come in and has not found a new doctor?

## 2015-01-21 ENCOUNTER — Ambulatory Visit (INDEPENDENT_AMBULATORY_CARE_PROVIDER_SITE_OTHER): Payer: Commercial Managed Care - PPO | Admitting: Family Medicine

## 2015-01-21 ENCOUNTER — Encounter: Payer: Self-pay | Admitting: Family Medicine

## 2015-01-21 VITALS — BP 138/98 | HR 95 | Temp 98.2°F | Resp 18 | Ht 65.0 in | Wt 232.2 lb

## 2015-01-21 DIAGNOSIS — R030 Elevated blood-pressure reading, without diagnosis of hypertension: Secondary | ICD-10-CM | POA: Diagnosis not present

## 2015-01-21 DIAGNOSIS — F988 Other specified behavioral and emotional disorders with onset usually occurring in childhood and adolescence: Secondary | ICD-10-CM

## 2015-01-21 DIAGNOSIS — IMO0001 Reserved for inherently not codable concepts without codable children: Secondary | ICD-10-CM

## 2015-01-21 DIAGNOSIS — F909 Attention-deficit hyperactivity disorder, unspecified type: Secondary | ICD-10-CM | POA: Diagnosis not present

## 2015-01-21 MED ORDER — METHYLPHENIDATE HCL ER (OSM) 54 MG PO TBCR: 54.0000 mg | EXTENDED_RELEASE_TABLET | ORAL | Status: AC

## 2015-01-21 NOTE — Patient Instructions (Signed)
Follow up in 3-4 weeks to recheck attention and BP Start the Concerta daily Limit your salt intake and increase your water intake- this will improve your blood pressure Try and get regular exercise Call with any questions or concerns Happy New Year!!!

## 2015-01-21 NOTE — Progress Notes (Signed)
Pre visit review using our clinic review tool, if applicable. No additional management support is needed unless otherwise documented below in the visit note. 

## 2015-01-21 NOTE — Assessment & Plan Note (Signed)
Deteriorated.  BP is again elevated- particularly the diastolic BP.  Pt reports she was upset over a phone call today.  Pt has had 'normal' but still mildly elevated BP readings at work.  Pt to f/u in 3-4 weeks for re-assessment.  Will follow closely.

## 2015-01-21 NOTE — Progress Notes (Signed)
   Subjective:    Patient ID: Ana Gray, female    DOB: 26-Jul-1975, 40 y.o.   MRN: [id number]  HPI ADHD- chronic problem, pt reports Strattera was too expensive.  Then switched to Focalin but she did not have any results.  Pt has also been on Concerta and Adderall.  Adderall increased BP.  Elevated BP- pt reports she has been checking BP at work [employer]) and 'it has been normal'.  Work BPs 135/75.  No CP, SOB, HAs, visual changes, edema.   Review of Systems For ROS see HPI     Objective:   Physical Exam  Constitutional: She is oriented to person, place, and time. She appears well-developed and well-nourished. No distress.  obese  HENT:  Head: Normocephalic and atraumatic.  Eyes: Conjunctivae and EOM are normal. Pupils are equal, round, and reactive to light.  Neck: Normal range of motion. Neck supple. No thyromegaly present.  Cardiovascular: Normal rate, regular rhythm, normal heart sounds and intact distal pulses.   No murmur heard. Pulmonary/Chest: Effort normal and breath sounds normal. No respiratory distress.  Abdominal: Soft. She exhibits no distension. There is no tenderness.  Musculoskeletal: She exhibits no edema.  Lymphadenopathy:    She has no cervical adenopathy.  Neurological: She is alert and oriented to person, place, and time.  Skin: Skin is warm and dry.  Psychiatric: She has a normal mood and affect. Her behavior is normal.  Vitals reviewed.         Assessment & Plan:

## 2015-01-21 NOTE — Assessment & Plan Note (Signed)
Deteriorated.  Pt has had considerably difficult time finding medication that was both effective and did not have side effects- BP elevation.  Strattera was too expensive, Focalin was ineffective, adderall raised BP.  Pt is willing to go back to Concerta and monitor for improvement.  She is to check her BP at work and notify if elevated.  She also has an appt w/ Psych upcoming on 1/18 for additional assistance w/ her ADD.  Will follow closely.

## 2015-02-24 ENCOUNTER — Ambulatory Visit: Payer: Commercial Managed Care - PPO | Admitting: Family Medicine

## 2015-06-21 IMAGING — CT CT ANGIO CHEST
2 of 6 series · 19 of 36 positions shown · IV contrast (APPLIED)
Comparison: None.

CLINICAL DATA: Chest pain for

EXAM:
CT ANGIOGRAPHY CHEST WITH CONTRAST
TECHNIQUE: Multidetector CT imaging of the chest was performed using the
standard protocol during bolus administration of intravenous
contrast. Multiplanar CT image reconstructions and MIPs were
obtained to evaluate the vascular anatomy.
CONTRAST:  100mL OMNIPAQUE IOHEXOL 350 MG/ML SOLN

[Series 6: pe 1.0 b26f · axial · 0.60mm/px · z∈[+1065,+1309]mm · 18 of 272 slices shown]
[im 14/272  lung]
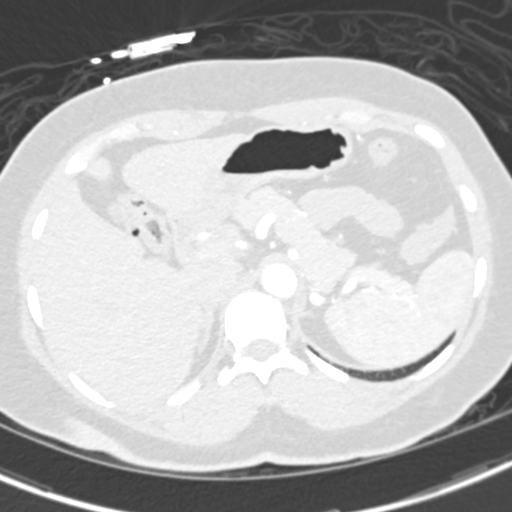
[im 28/272  mediastinal]
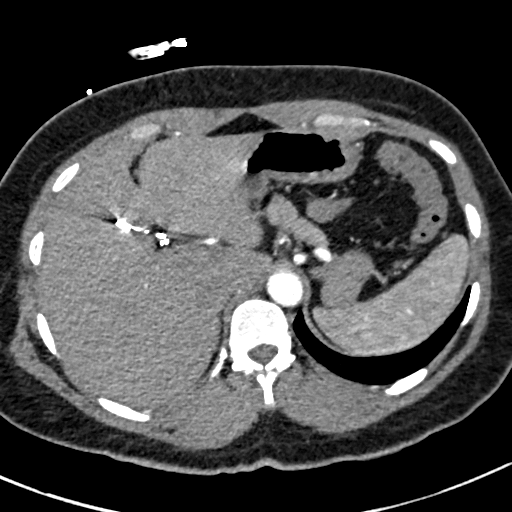
[im 41/272  lung]
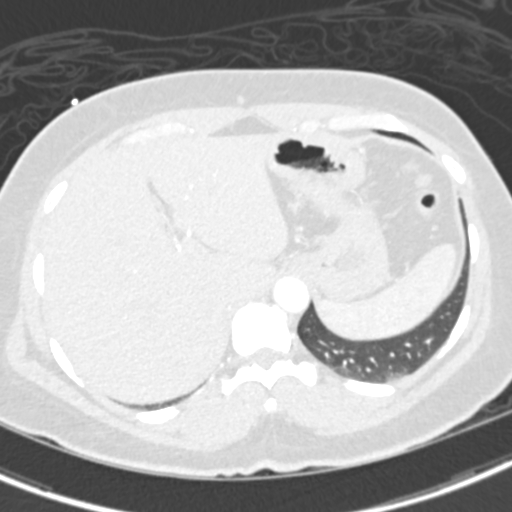
[im 55/272  mediastinal]
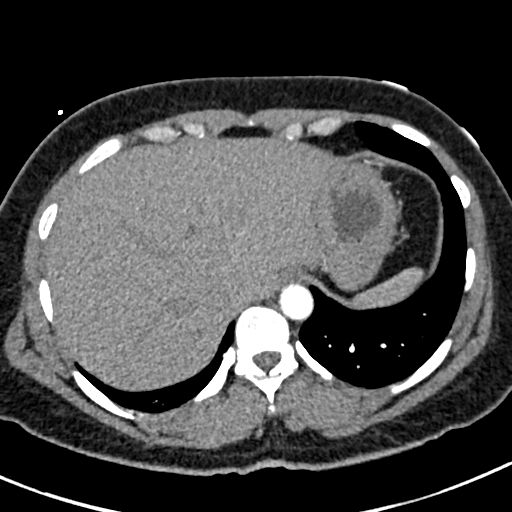
[im 68/272  lung]
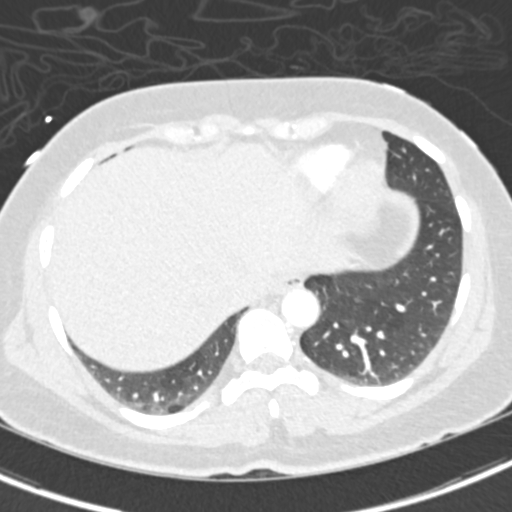
[im 82/272  mediastinal]
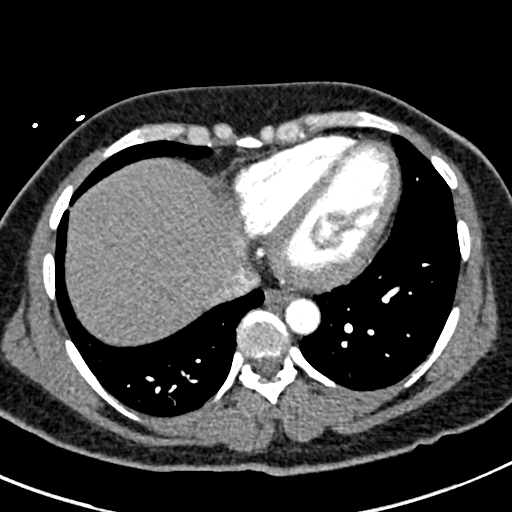
[im 95/272  lung]
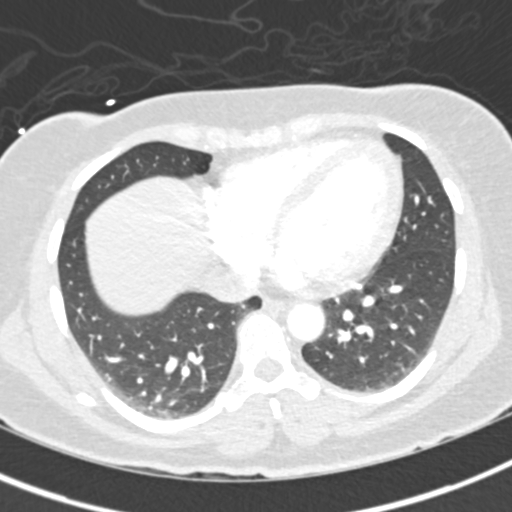
[im 109/272  mediastinal]
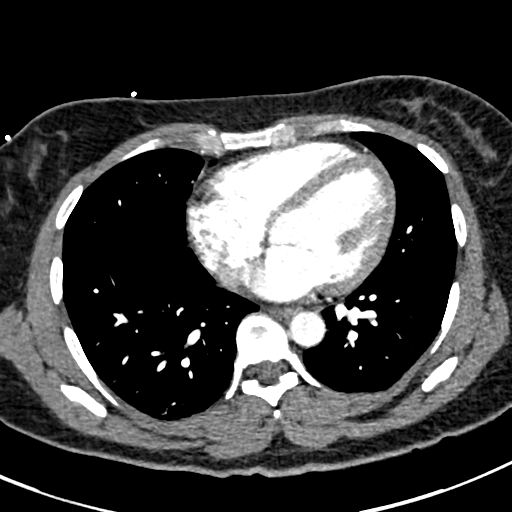
[im 122/272  lung]
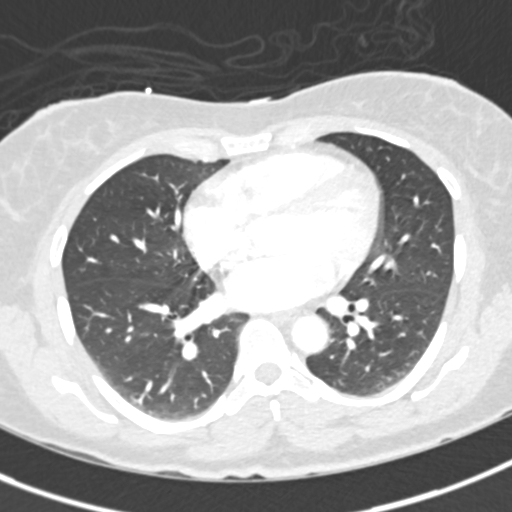
[im 150/272  mediastinal]
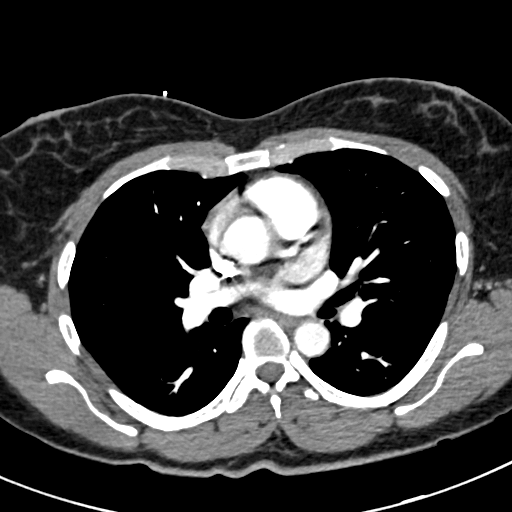
[im 163/272  lung]
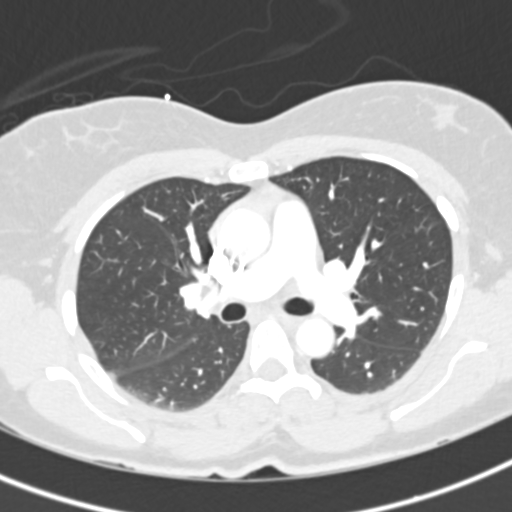
[im 177/272  mediastinal]
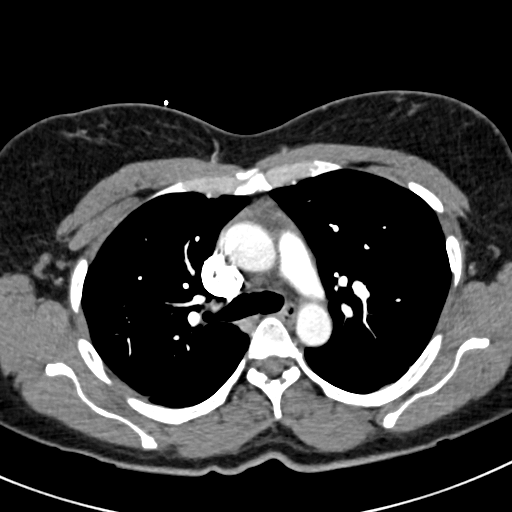
[im 190/272  lung]
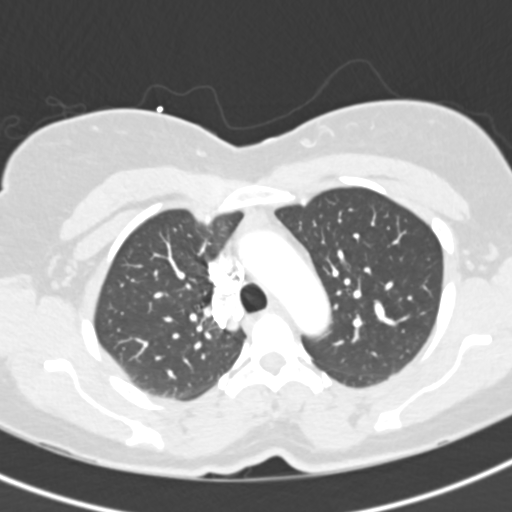
[im 204/272  mediastinal]
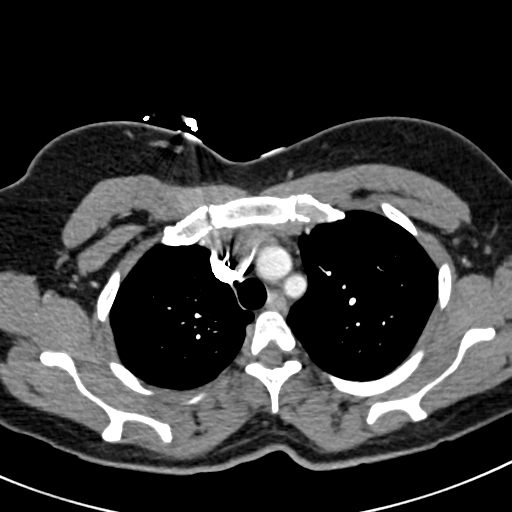
[im 217/272  lung]
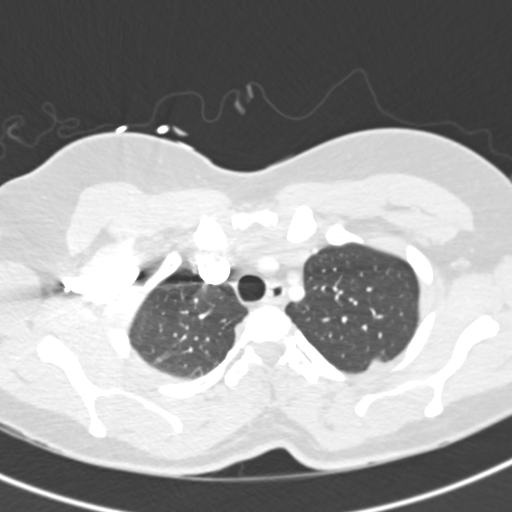
[im 231/272  mediastinal]
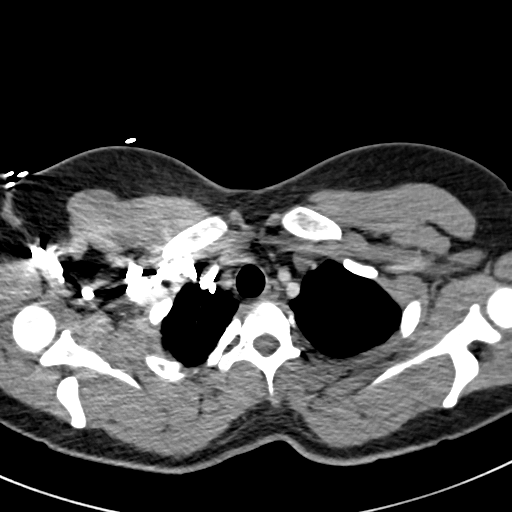
[im 244/272  lung]
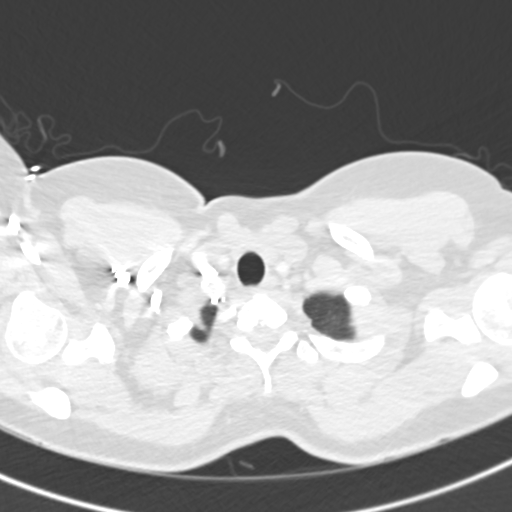
[im 258/272  mediastinal]
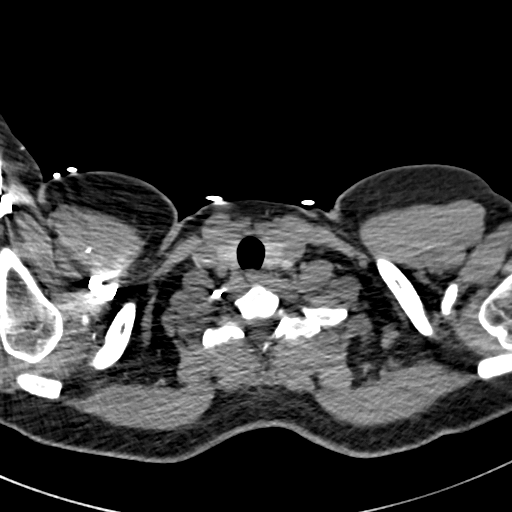

[Series 9: pe 2.0 coronal · coronal · 0.55mm/px · 1 of 114 slices shown]
[im 57/114  mediastinal]
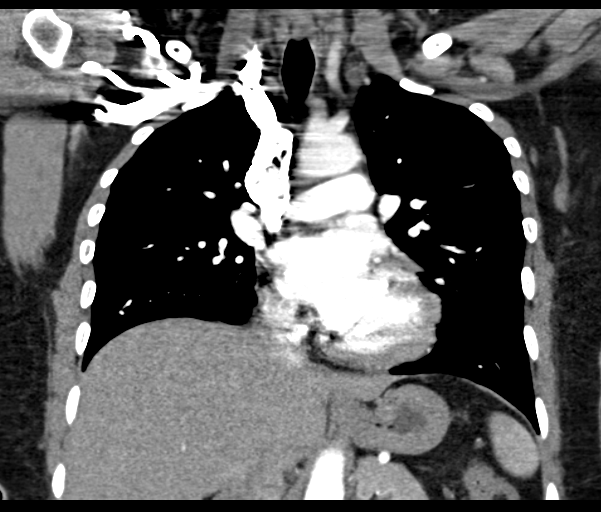

[19 of 36 positions shown; findings below may reference images not displayed]

FINDINGS: Lungs are well aerated bilaterally. No focal infiltrate or sizable
effusion is seen. Calcified granuloma is noted in the left lung
base.

The thoracic inlet is within normal limits. The thoracic aorta and
its branches are unremarkable. The pulmonary artery is well
visualized and demonstrates a normal branching pattern. No filling
defects to suggest pulmonary emboli are identified. No significant
hilar or mediastinal adenopathy is seen. Scanning into the upper
abdomen reveals changes of prior cholecystectomy. The osseous
structures are grossly unremarkable.

Review of the MIP images confirms the above findings.
IMPRESSION: No evidence of pulmonary emboli.

Findings consistent prior granulomatous disease.

No other focal abnormality is seen.

## 2015-07-15 ENCOUNTER — Other Ambulatory Visit: Payer: Self-pay | Admitting: Medical

## 2016-05-20 ENCOUNTER — Other Ambulatory Visit: Payer: Self-pay | Admitting: Obstetrics and Gynecology

## 2016-05-20 DIAGNOSIS — Z1231 Encounter for screening mammogram for malignant neoplasm of breast: Secondary | ICD-10-CM

## 2016-06-07 ENCOUNTER — Ambulatory Visit: Payer: Commercial Managed Care - PPO

## 2016-06-28 ENCOUNTER — Ambulatory Visit: Payer: Commercial Managed Care - PPO

## 2017-02-05 ENCOUNTER — Emergency Department (HOSPITAL_BASED_OUTPATIENT_CLINIC_OR_DEPARTMENT_OTHER)
Admission: EM | Admit: 2017-02-05 | Discharge: 2017-02-05 | Disposition: A | Discharge status: Home / Self Care | Payer: PRIVATE HEALTH INSURANCE | Attending: Emergency Medicine | Admitting: Emergency Medicine

## 2017-02-05 ENCOUNTER — Other Ambulatory Visit: Payer: Self-pay

## 2017-02-05 ENCOUNTER — Encounter (HOSPITAL_BASED_OUTPATIENT_CLINIC_OR_DEPARTMENT_OTHER): Payer: Self-pay | Admitting: Emergency Medicine

## 2017-02-05 DIAGNOSIS — Z79899 Other long term (current) drug therapy: Secondary | ICD-10-CM | POA: Diagnosis not present

## 2017-02-05 DIAGNOSIS — J111 Influenza due to unidentified influenza virus with other respiratory manifestations: Secondary | ICD-10-CM

## 2017-02-05 DIAGNOSIS — R69 Illness, unspecified: Secondary | ICD-10-CM

## 2017-02-05 DIAGNOSIS — E876 Hypokalemia: Secondary | ICD-10-CM | POA: Diagnosis not present

## 2017-02-05 DIAGNOSIS — F909 Attention-deficit hyperactivity disorder, unspecified type: Secondary | ICD-10-CM | POA: Insufficient documentation

## 2017-02-05 DIAGNOSIS — R05 Cough: Secondary | ICD-10-CM | POA: Diagnosis present

## 2017-02-05 DIAGNOSIS — J45909 Unspecified asthma, uncomplicated: Secondary | ICD-10-CM | POA: Diagnosis not present

## 2017-02-05 LAB — CBC WITH DIFFERENTIAL/PLATELET
BASOS PCT: 0 %
Basophils Absolute: 0 10*3/uL (ref 0.0–0.1)
EOS PCT: 0 %
Eosinophils Absolute: 0 10*3/uL (ref 0.0–0.7)
HEMATOCRIT: 38.6 % (ref 36.0–46.0)
Hemoglobin: 12.7 g/dL (ref 12.0–15.0)
LYMPHS ABS: 3.4 10*3/uL (ref 0.7–4.0)
Lymphocytes Relative: 17 %
MCH: 30.3 pg (ref 26.0–34.0)
MCHC: 32.9 g/dL (ref 30.0–36.0)
MCV: 92.1 fL (ref 78.0–100.0)
MONO ABS: 1 10*3/uL (ref 0.1–1.0)
MONOS PCT: 5 %
NEUTROS ABS: 15.6 10*3/uL — AB (ref 1.7–7.7)
Neutrophils Relative %: 78 %
PLATELETS: 252 10*3/uL (ref 150–400)
RBC: 4.19 MIL/uL (ref 3.87–5.11)
RDW: 13.7 % (ref 11.5–15.5)
WBC: 20 10*3/uL — ABNORMAL HIGH (ref 4.0–10.5)

## 2017-02-05 LAB — BASIC METABOLIC PANEL
Anion gap: 9 (ref 5–15)
BUN: 14 mg/dL (ref 6–20)
CALCIUM: 8.8 mg/dL — AB (ref 8.9–10.3)
CHLORIDE: 105 mmol/L (ref 101–111)
CO2: 26 mmol/L (ref 22–32)
CREATININE: 0.89 mg/dL (ref 0.44–1.00)
GFR calc non Af Amer: >60 mL/min (ref >60)
Glucose, Bld: 102 mg/dL — ABNORMAL HIGH (ref 65–99)
Potassium: 2.8 mmol/L — ABNORMAL LOW (ref 3.5–5.1)
Sodium: 140 mmol/L (ref 135–145)

## 2017-02-05 LAB — TROPONIN I: Troponin I: <0.03 ng/mL (ref <0.03)

## 2017-02-05 LAB — D-DIMER, QUANTITATIVE: D-Dimer, Quant: 0.43 ug/mL-FEU (ref 0.00–0.50)

## 2017-02-05 MED ORDER — ALBUTEROL SULFATE (2.5 MG/3ML) 0.083% IN NEBU
INHALATION_SOLUTION | RESPIRATORY_TRACT | Status: AC
  Administered 2017-02-05: 2.5 mg
  Filled 2017-02-05: qty 3

## 2017-02-05 MED ORDER — ALBUTEROL SULFATE HFA 108 (90 BASE) MCG/ACT IN AERS
2.0000 | INHALATION_SPRAY | Freq: Once | RESPIRATORY_TRACT | Status: AC
  Administered 2017-02-05: 2 via RESPIRATORY_TRACT
  Filled 2017-02-05: qty 6.7

## 2017-02-05 MED ORDER — SODIUM CHLORIDE 0.9 % IV BOLUS (SEPSIS)
1000.0000 mL | Freq: Once | INTRAVENOUS | Status: AC
  Administered 2017-02-05: 1000 mL via INTRAVENOUS

## 2017-02-05 MED ORDER — POTASSIUM CHLORIDE CRYS ER 20 MEQ PO TBCR
40.0000 meq | EXTENDED_RELEASE_TABLET | Freq: Once | ORAL | Status: AC
  Administered 2017-02-05: 40 meq via ORAL
  Filled 2017-02-05: qty 2

## 2017-02-05 MED ORDER — POTASSIUM CHLORIDE CRYS ER 20 MEQ PO TBCR: 20.0000 meq | EXTENDED_RELEASE_TABLET | Freq: Two times a day (BID) | ORAL | 0 refills | Status: AC

## 2017-02-05 MED ORDER — IPRATROPIUM-ALBUTEROL 0.5-2.5 (3) MG/3ML IN SOLN
RESPIRATORY_TRACT | Status: AC
  Administered 2017-02-05: 3 mL
  Filled 2017-02-05: qty 3

## 2017-02-05 NOTE — ED Notes (Signed)
Pt reports she was dx with flu on Monday (6 days ago). Returned to urgent care 2 days ago bc she spiked a fever and was given steroids and antibiotics. States she was feeling worse today so went back and was told she had pneumonia and needed to go to the hospital to rule out a PE. Pt cao x 4. Intermittent cough. NAD. States she feels better after receiving breathing tx in triage.

## 2017-02-05 NOTE — ED Provider Notes (Signed)
[facility] EMERGENCY DEPARTMENT Provider Note   CSN: [id number] Arrival date & time: 02/05/17  1205     History   Chief Complaint Chief Complaint  Patient presents with  . Cough  . Fever    HPI Ana Gray is a 42 y.o. female.  HPI  42 year old female presents with cough, fever, and shortness of breath.  Her symptoms started 6 days ago.  She went to urgent care 4 days ago, diagnosed clinically as influenza and declined Tamiflu.  Since then her symptoms have been worsening to she went back to urgent care a couple days ago and was put on amoxicillin/clavulanate, given prednisone, and cough medicine.  She was also given IM antibiotic and IM steroid at the time.  Briefly felt better but now getting worse so she went back today.  Given a breathing treatment and told that she had pneumonia.  However she was told she would also need a CT scan to rule out PE.  She states her most recent fever was 102 this morning.  She has been having fever for the last couple days and his shortness of breath seems new over the last couple days.  Her cough originally had productive sputum but now is dry.  Some sore throat and ear congestion/pressure.  When she went to go get the CT scan she was told she needed prior authorization so her doctor told her to come to the ER.  She has never had a DVT or PE and denies any leg swelling or leg pain.  Given a breathing treatment prior to me seeing her by respiratory and she does feel somewhat better.  She has had some chest pain that is pleuritic in her left anterior chest that she thinks is from coughing.  Past Medical History:  Diagnosis Date  . ADHD (attention deficit hyperactivity disorder)   . AMA (advanced maternal age) multigravida 26+   . Anemia   . Asthma   . Breast mass in female    benign  . Edema   . Headache(784.0)   . Obese   . Varicosities     Patient Active Problem List   Diagnosis Date Noted  . Back pain 05/07/2014  . GERD  (gastroesophageal reflux disease) 05/07/2014  . Elevated BP 10/18/2012  . Routine general medical examination at a health care facility 09/14/2012  . Attention deficit disorder 11/26/2009  . ALLERGIC RHINITIS 05/28/2009  . BREAST PAIN 05/28/2009  . MIGRAINE HEADACHE 11/03/2008  . HYPOKALEMIA 07/30/2008  . EDEMA 06/18/2008    Past Surgical History:  Procedure Laterality Date  . CHOLECYSTECTOMY    . CLOSED REDUCTION SHOULDER DISLOCATION     left  . COLPOSCOPY    . TUBAL LIGATION  01/22/2011   Procedure: POST PARTUM TUBAL LIGATION;  Surgeon: Eldred Manges, MD;  Location: [facility];  Service: Gynecology;  Laterality: Bilateral;  post partum tubal ligation bilateral    OB History    Gravida Para Term Preterm AB Living   4 3 3  0 1 3   SAB TAB Ectopic Multiple Live Births   1 0 0 0 3       Home Medications    Prior to Admission medications   Medication Sig Start Date End Date Taking? Authorizing Provider  baclofen (LIORESAL) 10 MG tablet Take 10 mg by mouth.    [provider]  ibuprofen (ADVIL,MOTRIN) 600 MG tablet Take 600 mg by mouth every 6 (six) hours as needed. Takes for pain  [provider]  methylphenidate 54 MG PO CR tablet Take 1 tablet (54 mg total) by mouth every morning. 01/21/15   Midge Minium, MD  omeprazole (PRILOSEC) 40 MG capsule TAKE 1 CAPSULE (40 MG TOTAL) BY MOUTH ONE TIME DAILY. 07/15/15   Saguier, Percell Miller, PA-C  potassium chloride SA (K-DUR,KLOR-CON) 20 MEQ tablet Take 1 tablet (20 mEq total) by mouth 2 (two) times daily. 02/05/17   Sherwood Gambler, MD  vitamin B-12 (CYANOCOBALAMIN) 100 MCG tablet Take 1 tablet (100 mcg total) by mouth daily. 11/03/14   Debbrah Alar, NP    Family History Family History  Problem Relation Age of Onset  . Other Unknown        non contributory  . Cancer Maternal Grandmother        throat  . Vision loss Cousin     Social History Social History   Tobacco Use  . Smoking status: Never Smoker   . Smokeless tobacco: Never Used  Substance Use Topics  . Alcohol use: No  . Drug use: No     Allergies   Patient has no known allergies.   Review of Systems Review of Systems  Constitutional: Positive for fever.  HENT: Positive for congestion, ear pain and sore throat.   Respiratory: Positive for cough and shortness of breath.   Cardiovascular: Positive for chest pain. Negative for leg swelling.  Gastrointestinal: Negative for abdominal pain and vomiting.  All other systems reviewed and are negative.    Physical Exam Updated Vital Signs BP (!) 138/95   Pulse 89   Temp 98.6 F (37 C) (Oral)   Resp 17   Ht 5\' 5"  (1.651 m)   Wt 107.5 kg (237 lb)   LMP 01/05/2017   SpO2 98%   BMI 39.44 kg/m   Physical Exam  Constitutional: She is oriented to person, place, and time. She appears well-developed and well-nourished. No distress.  HENT:  Head: Normocephalic and atraumatic.  Right Ear: Tympanic membrane, external ear and ear canal normal.  Left Ear: Tympanic membrane, external ear and ear canal normal.  Nose: Nose normal.  Mouth/Throat: No oropharyngeal exudate.  Eyes: Right eye exhibits no discharge. Left eye exhibits no discharge.  Cardiovascular: Normal rate, regular rhythm and normal heart sounds.  Pulmonary/Chest: Effort normal. She has wheezes.  Speaks in complete sentences. Good in and out air movement, scattered brief expiratory wheezes, seems to clear with breathing  Abdominal: Soft. There is no tenderness.  Musculoskeletal: She exhibits no edema.  Neurological: She is alert and oriented to person, place, and time.  Skin: Skin is warm and dry. She is not diaphoretic.  Nursing note and vitals reviewed.    ED Treatments / Results  Labs (all labs ordered are listed, but only abnormal results are displayed) Labs Reviewed  BASIC METABOLIC PANEL - Abnormal; Notable for the following components:      Result Value   Potassium 2.8 (*)    Glucose, Bld 102 (*)     Calcium 8.8 (*)    All other components within normal limits  CBC WITH DIFFERENTIAL/PLATELET - Abnormal; Notable for the following components:   WBC 20.0 (*)    Neutro Abs 15.6 (*)    All other components within normal limits  TROPONIN I  D-DIMER, QUANTITATIVE (NOT AT [facility])    EKG  EKG Interpretation  Date/Time:  Sunday February 05 2017 13:42:13 EST Ventricular Rate:  87 PR Interval:    QRS Duration: 99 QT Interval:  371 QTC Calculation:  447 R Axis:   71 Text Interpretation:  Sinus rhythm no acute ST/T changes no significant change since 2016 Confirmed by Sherwood Gambler [phone]) on 02/05/2017 2:09:36 PM       Radiology No results found.  Procedures Procedures (including critical care time)  Medications Ordered in ED Medications  albuterol (PROVENTIL) (2.5 MG/3ML) 0.083% nebulizer solution (2.5 mg  Given 02/05/17 1221)  ipratropium-albuterol (DUONEB) 0.5-2.5 (3) MG/3ML nebulizer solution (3 mLs  Given 02/05/17 1221)  sodium chloride 0.9 % bolus 1,000 mL (0 mLs Intravenous Stopped 02/05/17 1516)  albuterol (PROVENTIL HFA;VENTOLIN HFA) 108 (90 Base) MCG/ACT inhaler 2 puff (2 puffs Inhalation Given 02/05/17 1336)  potassium chloride SA (K-DUR,KLOR-CON) CR tablet 40 mEq (40 mEq Oral Given 02/05/17 1414)     Initial Impression / Assessment and Plan / ED Course  I have reviewed the triage vital signs and the nursing notes.  Pertinent labs & imaging results that were available during my care of the patient were reviewed by me and considered in my medical decision making (see chart for details).     Patient's urgent care doctor was concerned about PE, but her story with fevers, cough, congestion, sore throat all sounds like a flulike/viral illness.  Given she has no other risk factors besides some tachycardia in the office earlier, a d-dimer was sent for otherwise low risk PE and is negative.  I do not think CT is necessary.  She feels better with albuterol.  She does have hypokalemia  which might be from albuterol but she has not been using a significant amount and so she will be repleted orally.  ECG with normal QTC.  Otherwise, she feels well enough for discharge.  She will be given an inhaler to use her WBC is significantly elevated but given she is currently on steroids I think this is unlikely to be pathologic.  I cannot see her x-ray images but the radiologist reports no pneumonia earlier today at the outside urgent care.  Discussed plan with patient, including close follow-up with PCP and strict return precautions.  Final Clinical Impressions(s) / ED Diagnoses   Final diagnoses:  Influenza-like illness  Hypokalemia    ED Discharge Orders        Ordered    potassium chloride SA (K-DUR,KLOR-CON) 20 MEQ tablet  2 times daily     02/05/17 1509       Sherwood Gambler, MD 02/05/17 1537

## 2017-02-05 NOTE — ED Triage Notes (Addendum)
Cough, fever since Wednesday. Seen by PCP and given abx for pneumonia and steroids. Pt states symptoms are not improving. Pt sent from UC. Had neb tx PTA.

## 2017-02-05 NOTE — Discharge Instructions (Signed)
You may use the albuterol inhaler 1 or 2 puffs every 4 hours as needed for shortness of breath or cough.  Follow-up with your primary care doctor in the next 2 or 3 days.  Return if any symptoms were to worsen or not improve.

## 2017-02-05 NOTE — ED Notes (Signed)
ED Provider at bedside. 

## 2018-01-02 LAB — HM HEPATITIS C SCREENING LAB: HM Hepatitis Screen: NEGATIVE

## 2018-05-24 ENCOUNTER — Emergency Department (HOSPITAL_BASED_OUTPATIENT_CLINIC_OR_DEPARTMENT_OTHER): Payer: PRIVATE HEALTH INSURANCE

## 2018-05-24 ENCOUNTER — Emergency Department (HOSPITAL_BASED_OUTPATIENT_CLINIC_OR_DEPARTMENT_OTHER)
Admission: EM | Admit: 2018-05-24 | Discharge: 2018-05-24 | Disposition: A | Discharge status: Home / Self Care | Payer: PRIVATE HEALTH INSURANCE | Attending: Emergency Medicine | Admitting: Emergency Medicine

## 2018-05-24 ENCOUNTER — Encounter (HOSPITAL_BASED_OUTPATIENT_CLINIC_OR_DEPARTMENT_OTHER): Payer: Self-pay

## 2018-05-24 ENCOUNTER — Other Ambulatory Visit: Payer: Self-pay

## 2018-05-24 DIAGNOSIS — J45909 Unspecified asthma, uncomplicated: Secondary | ICD-10-CM | POA: Diagnosis not present

## 2018-05-24 DIAGNOSIS — Y92410 Unspecified street and highway as the place of occurrence of the external cause: Secondary | ICD-10-CM | POA: Insufficient documentation

## 2018-05-24 DIAGNOSIS — Z79899 Other long term (current) drug therapy: Secondary | ICD-10-CM | POA: Insufficient documentation

## 2018-05-24 DIAGNOSIS — S86911A Strain of unspecified muscle(s) and tendon(s) at lower leg level, right leg, initial encounter: Secondary | ICD-10-CM

## 2018-05-24 DIAGNOSIS — Y999 Unspecified external cause status: Secondary | ICD-10-CM | POA: Diagnosis not present

## 2018-05-24 DIAGNOSIS — I10 Essential (primary) hypertension: Secondary | ICD-10-CM | POA: Diagnosis not present

## 2018-05-24 DIAGNOSIS — Y9389 Activity, other specified: Secondary | ICD-10-CM | POA: Diagnosis not present

## 2018-05-24 DIAGNOSIS — S39012A Strain of muscle, fascia and tendon of lower back, initial encounter: Secondary | ICD-10-CM | POA: Insufficient documentation

## 2018-05-24 DIAGNOSIS — S3992XA Unspecified injury of lower back, initial encounter: Secondary | ICD-10-CM | POA: Diagnosis present

## 2018-05-24 HISTORY — DX: Arteritis, unspecified: I77.6

## 2018-05-24 HISTORY — DX: Essential (primary) hypertension: I10

## 2018-05-24 HISTORY — DX: Immunodeficiency, unspecified: D84.9

## 2018-05-24 HISTORY — DX: Unspecified visual loss: H54.7

## 2018-05-24 LAB — PREGNANCY, URINE: Preg Test, Ur: NEGATIVE

## 2018-05-24 MED ORDER — CYCLOBENZAPRINE HCL 5 MG PO TABS
5.0000 mg | ORAL_TABLET | Freq: Once | ORAL | Status: AC
  Administered 2018-05-24: 20:00:00 5 mg via ORAL
  Filled 2018-05-24: qty 1

## 2018-05-24 MED ORDER — CYCLOBENZAPRINE HCL 10 MG PO TABS: 10.0000 mg | ORAL_TABLET | Freq: Two times a day (BID) | ORAL | 0 refills | Status: DC | PRN

## 2018-05-24 MED ORDER — NAPROXEN 500 MG PO TABS: 500.0000 mg | ORAL_TABLET | Freq: Two times a day (BID) | ORAL | 0 refills | Status: AC

## 2018-05-24 MED ORDER — KETOROLAC TROMETHAMINE 15 MG/ML IJ SOLN
15.0000 mg | Freq: Once | INTRAMUSCULAR | Status: AC
  Administered 2018-05-24: 20:00:00 15 mg via INTRAVENOUS
  Filled 2018-05-24: qty 1

## 2018-05-24 MED ORDER — CYCLOBENZAPRINE HCL 10 MG PO TABS: 10.0000 mg | ORAL_TABLET | Freq: Two times a day (BID) | ORAL | 0 refills | Status: AC | PRN

## 2018-05-24 MED ORDER — NAPROXEN 500 MG PO TABS: 500.0000 mg | ORAL_TABLET | Freq: Two times a day (BID) | ORAL | 0 refills | Status: DC

## 2018-05-24 MED ORDER — PREDNISONE 50 MG PO TABS
60.0000 mg | ORAL_TABLET | Freq: Once | ORAL | Status: AC
  Administered 2018-05-24: 60 mg via ORAL
  Filled 2018-05-24: qty 1
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
## 2018-05-24 NOTE — Discharge Instructions (Addendum)
Thank you for allowing me to care for you today. Please return to the emergency department if you have new or worsening symptoms. Take your medications as instructed.  ° °
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
## 2019-06-13 DIAGNOSIS — T3695XA Adverse effect of unspecified systemic antibiotic, initial encounter: Secondary | ICD-10-CM | POA: Diagnosis not present

## 2019-06-13 DIAGNOSIS — J0140 Acute pansinusitis, unspecified: Secondary | ICD-10-CM | POA: Diagnosis not present

## 2019-06-13 MED FILL — FLUCONAZOLE 150 MG TABS: 150 | 1 days supply | Qty: 1 | Fill #0

## 2019-06-13 MED FILL — predniSONE 10 MG TABS: 10 | 6 days supply | Qty: 21 | Fill #0

## 2019-06-13 MED FILL — AMOX-CLAV 875-125 MG TABLET: 875-125 | 10 days supply | Qty: 20 | Fill #0

## 2019-06-21 DIAGNOSIS — R05 Cough: Secondary | ICD-10-CM | POA: Diagnosis not present

## 2019-06-21 DIAGNOSIS — J029 Acute pharyngitis, unspecified: Secondary | ICD-10-CM | POA: Diagnosis not present

## 2019-07-01 DIAGNOSIS — Z79899 Other long term (current) drug therapy: Secondary | ICD-10-CM | POA: Diagnosis not present

## 2019-07-01 DIAGNOSIS — F902 Attention-deficit hyperactivity disorder, combined type: Secondary | ICD-10-CM | POA: Diagnosis not present

## 2019-07-01 MED FILL — AMPHETAMINE-DEXTROAMPHETAMI: 20 | 30 days supply | Qty: 60 | Fill #0

## 2019-07-01 MED FILL — ADDERALL XR 30 MG CAP SA: 30 | 30 days supply | Qty: 30 | Fill #0

## 2019-07-11 DIAGNOSIS — I10 Essential (primary) hypertension: Secondary | ICD-10-CM | POA: Diagnosis not present

## 2019-07-11 DIAGNOSIS — Z79899 Other long term (current) drug therapy: Secondary | ICD-10-CM | POA: Diagnosis not present

## 2019-07-11 DIAGNOSIS — H30003 Unspecified focal chorioretinal inflammation, bilateral: Secondary | ICD-10-CM | POA: Diagnosis not present

## 2019-07-11 DIAGNOSIS — H348312 Tributary (branch) retinal vein occlusion, right eye, stable: Secondary | ICD-10-CM | POA: Diagnosis not present

## 2019-07-11 DIAGNOSIS — H35033 Hypertensive retinopathy, bilateral: Secondary | ICD-10-CM | POA: Diagnosis not present

## 2019-07-11 DIAGNOSIS — H319 Unspecified disorder of choroid: Secondary | ICD-10-CM | POA: Diagnosis not present

## 2019-07-11 MED FILL — azaTHIOprine 50 MG TABS: 50 | 30 days supply | Qty: 60 | Fill #0

## 2019-07-19 MED FILL — BENAZEPRIL HCL 20 MG TABLET: 20 | 30 days supply | Qty: 30 | Fill #0

## 2019-08-02 MED FILL — ADDERALL XR 30 MG CAP SA: 30 | 30 days supply | Qty: 30 | Fill #0

## 2019-08-14 DIAGNOSIS — I10 Essential (primary) hypertension: Secondary | ICD-10-CM | POA: Diagnosis not present

## 2019-08-14 DIAGNOSIS — R079 Chest pain, unspecified: Secondary | ICD-10-CM | POA: Diagnosis not present

## 2019-08-14 DIAGNOSIS — R635 Abnormal weight gain: Secondary | ICD-10-CM | POA: Diagnosis not present

## 2019-08-14 MED FILL — azaTHIOprine 50 MG TABS: 50 | 30 days supply | Qty: 60 | Fill #1

## 2019-08-15 DIAGNOSIS — R9431 Abnormal electrocardiogram [ECG] [EKG]: Secondary | ICD-10-CM | POA: Diagnosis not present

## 2019-08-15 DIAGNOSIS — I10 Essential (primary) hypertension: Secondary | ICD-10-CM | POA: Diagnosis not present

## 2019-08-15 DIAGNOSIS — R635 Abnormal weight gain: Secondary | ICD-10-CM | POA: Diagnosis not present

## 2019-08-15 DIAGNOSIS — Z79899 Other long term (current) drug therapy: Secondary | ICD-10-CM | POA: Diagnosis not present

## 2019-08-15 DIAGNOSIS — H30003 Unspecified focal chorioretinal inflammation, bilateral: Secondary | ICD-10-CM | POA: Diagnosis not present

## 2019-08-19 ENCOUNTER — Other Ambulatory Visit (HOSPITAL_BASED_OUTPATIENT_CLINIC_OR_DEPARTMENT_OTHER): Payer: Self-pay | Admitting: Nurse Practitioner

## 2019-08-19 MED FILL — BENAZEPRIL HCL 40 MG TABLET: 40 | 90 days supply | Qty: 90 | Fill #0

## 2019-08-28 DIAGNOSIS — I10 Essential (primary) hypertension: Secondary | ICD-10-CM | POA: Diagnosis not present

## 2019-08-28 DIAGNOSIS — Z6839 Body mass index (BMI) 39.0-39.9, adult: Secondary | ICD-10-CM | POA: Diagnosis not present

## 2019-08-28 MED FILL — OZEMPIC 0.25 OR 0.5 MG/DOSE: 2 | 42 days supply | Qty: 2 | Fill #0

## 2019-09-05 MED FILL — ADDERALL XR 30 MG CAP SA: 30 | 30 days supply | Qty: 30 | Fill #0

## 2019-09-12 DIAGNOSIS — M791 Myalgia, unspecified site: Secondary | ICD-10-CM | POA: Diagnosis not present

## 2019-09-12 DIAGNOSIS — R509 Fever, unspecified: Secondary | ICD-10-CM | POA: Diagnosis not present

## 2019-09-12 DIAGNOSIS — J019 Acute sinusitis, unspecified: Secondary | ICD-10-CM | POA: Diagnosis not present

## 2019-09-12 DIAGNOSIS — R519 Headache, unspecified: Secondary | ICD-10-CM | POA: Diagnosis not present

## 2019-09-12 DIAGNOSIS — J029 Acute pharyngitis, unspecified: Secondary | ICD-10-CM | POA: Diagnosis not present

## 2019-09-12 DIAGNOSIS — B9789 Other viral agents as the cause of diseases classified elsewhere: Secondary | ICD-10-CM | POA: Diagnosis not present

## 2019-09-12 DIAGNOSIS — R067 Sneezing: Secondary | ICD-10-CM | POA: Diagnosis not present

## 2019-09-12 DIAGNOSIS — R0981 Nasal congestion: Secondary | ICD-10-CM | POA: Diagnosis not present

## 2019-09-12 DIAGNOSIS — H9203 Otalgia, bilateral: Secondary | ICD-10-CM | POA: Diagnosis not present

## 2019-09-12 DIAGNOSIS — Z20822 Contact with and (suspected) exposure to covid-19: Secondary | ICD-10-CM | POA: Diagnosis not present

## 2019-09-12 DIAGNOSIS — R0982 Postnasal drip: Secondary | ICD-10-CM | POA: Diagnosis not present

## 2019-09-12 MED FILL — SM MUCUS RELIEF 600 MG TB12: 600 | 20 days supply | Qty: 40 | Fill #0

## 2019-09-12 MED FILL — PSEUDOEPH-BROMPHEN-DM 30-2-: 30-2-10 | 8 days supply | Qty: 150 | Fill #0

## 2019-09-16 MED FILL — azaTHIOprine 50 MG TABS: 50 | 30 days supply | Qty: 60 | Fill #2

## 2019-09-25 DIAGNOSIS — Z79899 Other long term (current) drug therapy: Secondary | ICD-10-CM | POA: Diagnosis not present

## 2019-09-25 DIAGNOSIS — F902 Attention-deficit hyperactivity disorder, combined type: Secondary | ICD-10-CM | POA: Diagnosis not present

## 2019-10-01 MED FILL — ADDERALL XR 30 MG CAP SA: 30 | 30 days supply | Qty: 30 | Fill #0

## 2019-10-01 MED FILL — DEXTROAMP-AMPHETAMIN 20 MG: 20 | 30 days supply | Qty: 60 | Fill #0

## 2019-10-07 ENCOUNTER — Other Ambulatory Visit (HOSPITAL_BASED_OUTPATIENT_CLINIC_OR_DEPARTMENT_OTHER): Payer: Self-pay | Admitting: Nurse Practitioner

## 2019-10-07 DIAGNOSIS — I10 Essential (primary) hypertension: Secondary | ICD-10-CM | POA: Diagnosis not present

## 2019-10-07 DIAGNOSIS — Z6839 Body mass index (BMI) 39.0-39.9, adult: Secondary | ICD-10-CM | POA: Diagnosis not present

## 2019-10-07 DIAGNOSIS — E559 Vitamin D deficiency, unspecified: Secondary | ICD-10-CM | POA: Diagnosis not present

## 2019-10-07 DIAGNOSIS — Z0000 Encounter for general adult medical examination without abnormal findings: Secondary | ICD-10-CM | POA: Diagnosis not present

## 2019-10-07 DIAGNOSIS — Z1231 Encounter for screening mammogram for malignant neoplasm of breast: Secondary | ICD-10-CM | POA: Diagnosis not present

## 2019-10-07 DIAGNOSIS — E538 Deficiency of other specified B group vitamins: Secondary | ICD-10-CM | POA: Diagnosis not present

## 2019-10-07 DIAGNOSIS — K219 Gastro-esophageal reflux disease without esophagitis: Secondary | ICD-10-CM | POA: Diagnosis not present

## 2019-10-07 MED FILL — OMEPRAZOLE 40 MG CPDR: 40 | 90 days supply | Qty: 90 | Fill #0

## 2019-10-07 MED FILL — HYDROCHLOROTHIAZIDE 25 MG T: 25 | 60 days supply | Qty: 30 | Fill #0

## 2019-10-10 ENCOUNTER — Other Ambulatory Visit (HOSPITAL_BASED_OUTPATIENT_CLINIC_OR_DEPARTMENT_OTHER): Payer: Self-pay | Admitting: Ophthalmology

## 2019-10-10 DIAGNOSIS — H319 Unspecified disorder of choroid: Secondary | ICD-10-CM | POA: Diagnosis not present

## 2019-10-10 DIAGNOSIS — H348312 Tributary (branch) retinal vein occlusion, right eye, stable: Secondary | ICD-10-CM | POA: Diagnosis not present

## 2019-10-10 DIAGNOSIS — H35033 Hypertensive retinopathy, bilateral: Secondary | ICD-10-CM | POA: Diagnosis not present

## 2019-10-10 DIAGNOSIS — H30003 Unspecified focal chorioretinal inflammation, bilateral: Secondary | ICD-10-CM | POA: Diagnosis not present

## 2019-10-10 DIAGNOSIS — Z79899 Other long term (current) drug therapy: Secondary | ICD-10-CM | POA: Diagnosis not present

## 2019-10-10 DIAGNOSIS — I10 Essential (primary) hypertension: Secondary | ICD-10-CM | POA: Diagnosis not present

## 2019-10-10 MED FILL — azaTHIOprine 50 MG TABS: 50 | 30 days supply | Qty: 60 | Fill #0

## 2019-10-30 ENCOUNTER — Other Ambulatory Visit (HOSPITAL_BASED_OUTPATIENT_CLINIC_OR_DEPARTMENT_OTHER): Payer: Self-pay | Admitting: Obstetrics and Gynecology

## 2019-10-30 DIAGNOSIS — Z01419 Encounter for gynecological examination (general) (routine) without abnormal findings: Secondary | ICD-10-CM | POA: Diagnosis not present

## 2019-10-30 DIAGNOSIS — Z113 Encounter for screening for infections with a predominantly sexual mode of transmission: Secondary | ICD-10-CM | POA: Diagnosis not present

## 2019-10-30 DIAGNOSIS — N946 Dysmenorrhea, unspecified: Secondary | ICD-10-CM | POA: Diagnosis not present

## 2019-10-30 DIAGNOSIS — Z124 Encounter for screening for malignant neoplasm of cervix: Secondary | ICD-10-CM | POA: Diagnosis not present

## 2019-10-30 DIAGNOSIS — Z1151 Encounter for screening for human papillomavirus (HPV): Secondary | ICD-10-CM | POA: Diagnosis not present

## 2019-10-30 LAB — HM PAP SMEAR

## 2019-10-30 MED FILL — NAPROXEN 500 MG TABS: 500 | 30 days supply | Qty: 60 | Fill #0

## 2019-11-06 ENCOUNTER — Other Ambulatory Visit (HOSPITAL_BASED_OUTPATIENT_CLINIC_OR_DEPARTMENT_OTHER): Payer: Self-pay | Admitting: General Practice

## 2019-11-06 MED FILL — FLUCONAZOLE 150 MG TABS: 150 | 5 days supply | Qty: 2 | Fill #0

## 2019-11-07 DIAGNOSIS — Z1231 Encounter for screening mammogram for malignant neoplasm of breast: Secondary | ICD-10-CM | POA: Diagnosis not present

## 2019-11-11 LAB — HM MAMMOGRAPHY

## 2019-11-20 MED FILL — ADDERALL XR 30 MG CAP SA: 30 | 30 days supply | Qty: 30 | Fill #0

## 2019-11-20 MED FILL — azaTHIOprine 50 MG TABS: 50 | 30 days supply | Qty: 60 | Fill #1

## 2019-11-20 MED FILL — BENAZEPRIL HCL 40 MG TABLET: 40 | 90 days supply | Qty: 90 | Fill #1

## 2019-11-25 ENCOUNTER — Other Ambulatory Visit (HOSPITAL_BASED_OUTPATIENT_CLINIC_OR_DEPARTMENT_OTHER): Payer: Self-pay | Admitting: General Practice

## 2019-11-25 MED FILL — SCOPOLAMINE 1 MG/3 DAY PATC: 1 | 12 days supply | Qty: 4 | Fill #0

## 2019-11-27 ENCOUNTER — Other Ambulatory Visit (HOSPITAL_BASED_OUTPATIENT_CLINIC_OR_DEPARTMENT_OTHER): Payer: Self-pay | Admitting: General Practice

## 2019-11-27 MED FILL — HYDROCHLOROTHIAZIDE 25 MG T: 25 | 30 days supply | Qty: 15 | Fill #0

## 2019-11-29 DIAGNOSIS — Z20822 Contact with and (suspected) exposure to covid-19: Secondary | ICD-10-CM | POA: Diagnosis not present

## 2019-12-20 ENCOUNTER — Other Ambulatory Visit (HOSPITAL_BASED_OUTPATIENT_CLINIC_OR_DEPARTMENT_OTHER): Payer: Self-pay | Admitting: Psychiatry

## 2019-12-20 DIAGNOSIS — F902 Attention-deficit hyperactivity disorder, combined type: Secondary | ICD-10-CM | POA: Diagnosis not present

## 2019-12-20 DIAGNOSIS — Z79899 Other long term (current) drug therapy: Secondary | ICD-10-CM | POA: Diagnosis not present

## 2019-12-20 MED FILL — AMPHETAMINE-DEXTROAMPHETAMI: 20 | 30 days supply | Qty: 60 | Fill #0

## 2019-12-20 MED FILL — ADDERALL XR 30 MG CAP SA: 30 | 30 days supply | Qty: 30 | Fill #0

## 2020-01-06 ENCOUNTER — Other Ambulatory Visit (HOSPITAL_BASED_OUTPATIENT_CLINIC_OR_DEPARTMENT_OTHER): Payer: Self-pay | Admitting: Ophthalmology

## 2020-01-06 DIAGNOSIS — H30003 Unspecified focal chorioretinal inflammation, bilateral: Secondary | ICD-10-CM | POA: Diagnosis not present

## 2020-01-06 DIAGNOSIS — H35033 Hypertensive retinopathy, bilateral: Secondary | ICD-10-CM | POA: Diagnosis not present

## 2020-01-06 DIAGNOSIS — H30033 Focal chorioretinal inflammation, peripheral, bilateral: Secondary | ICD-10-CM | POA: Diagnosis not present

## 2020-01-06 DIAGNOSIS — I10 Essential (primary) hypertension: Secondary | ICD-10-CM | POA: Diagnosis not present

## 2020-01-06 DIAGNOSIS — Z79899 Other long term (current) drug therapy: Secondary | ICD-10-CM | POA: Diagnosis not present

## 2020-01-06 DIAGNOSIS — H319 Unspecified disorder of choroid: Secondary | ICD-10-CM | POA: Diagnosis not present

## 2020-01-06 DIAGNOSIS — H348312 Tributary (branch) retinal vein occlusion, right eye, stable: Secondary | ICD-10-CM | POA: Diagnosis not present

## 2020-01-06 DIAGNOSIS — H318 Other specified disorders of choroid: Secondary | ICD-10-CM | POA: Diagnosis not present

## 2020-01-06 MED FILL — azaTHIOprine 50 MG TABS: 50 | 30 days supply | Qty: 60 | Fill #0

## 2020-01-07 ENCOUNTER — Other Ambulatory Visit (HOSPITAL_BASED_OUTPATIENT_CLINIC_OR_DEPARTMENT_OTHER): Payer: Self-pay | Admitting: General Practice

## 2020-01-07 DIAGNOSIS — J0100 Acute maxillary sinusitis, unspecified: Secondary | ICD-10-CM | POA: Diagnosis not present

## 2020-01-07 MED FILL — AMOX-CLAV 875-125 MG TABLET: 875-125 | 10 days supply | Qty: 20 | Fill #0

## 2020-01-08 ENCOUNTER — Other Ambulatory Visit (HOSPITAL_BASED_OUTPATIENT_CLINIC_OR_DEPARTMENT_OTHER): Payer: Self-pay | Admitting: General Practice

## 2020-01-08 MED FILL — FLUCONAZOLE 150 MG TABS: 150 | 2 days supply | Qty: 2 | Fill #0

## 2020-01-08 MED FILL — OMEPRAZOLE 40 MG CPDR: 40 | 90 days supply | Qty: 90 | Fill #1

## 2020-01-13 ENCOUNTER — Other Ambulatory Visit (HOSPITAL_BASED_OUTPATIENT_CLINIC_OR_DEPARTMENT_OTHER): Payer: Self-pay | Admitting: Nurse Practitioner

## 2020-01-13 DIAGNOSIS — K219 Gastro-esophageal reflux disease without esophagitis: Secondary | ICD-10-CM | POA: Diagnosis not present

## 2020-01-13 DIAGNOSIS — I10 Essential (primary) hypertension: Secondary | ICD-10-CM | POA: Diagnosis not present

## 2020-01-13 DIAGNOSIS — E876 Hypokalemia: Secondary | ICD-10-CM | POA: Diagnosis not present

## 2020-01-13 DIAGNOSIS — J4550 Severe persistent asthma, uncomplicated: Secondary | ICD-10-CM | POA: Diagnosis not present

## 2020-01-13 MED FILL — HYDROCHLOROTHIAZIDE 25 MG T: 25 | 90 days supply | Qty: 45 | Fill #0

## 2020-01-15 ENCOUNTER — Other Ambulatory Visit (HOSPITAL_BASED_OUTPATIENT_CLINIC_OR_DEPARTMENT_OTHER): Payer: Self-pay | Admitting: Nurse Practitioner

## 2020-01-16 MED FILL — POTASSIUM CHLORIDE CRYS ER: 20 | 7 days supply | Qty: 7 | Fill #0

## 2020-01-24 ENCOUNTER — Other Ambulatory Visit (HOSPITAL_BASED_OUTPATIENT_CLINIC_OR_DEPARTMENT_OTHER): Payer: Self-pay | Admitting: General Practice

## 2020-01-24 MED FILL — HYCODAN 5-1.5 MG/5ML SYRP: 5-1.5 | 6 days supply | Qty: 120 | Fill #0

## 2020-01-27 DIAGNOSIS — E876 Hypokalemia: Secondary | ICD-10-CM | POA: Diagnosis not present

## 2020-01-27 DIAGNOSIS — I10 Essential (primary) hypertension: Secondary | ICD-10-CM | POA: Diagnosis not present

## 2020-02-06 MED FILL — azaTHIOprine 50 MG TABS: 50 | 30 days supply | Qty: 60 | Fill #1

## 2020-02-06 MED FILL — NAPROXEN 500 MG TABS: 500 | 30 days supply | Qty: 60 | Fill #1

## 2020-02-06 MED FILL — ADDERALL XR 30 MG CAP SA: 30 | 30 days supply | Qty: 30 | Fill #0

## 2020-03-04 MED FILL — azaTHIOprine 50 MG TABS: 50 | 30 days supply | Qty: 60 | Fill #2

## 2020-03-04 MED FILL — BENAZEPRIL HCL 40 MG TABLET: 40 | 90 days supply | Qty: 90 | Fill #2

## 2020-03-05 MED FILL — ADDERALL XR 30 MG CAP SA: 30 | 30 days supply | Qty: 30 | Fill #0

## 2020-03-20 ENCOUNTER — Other Ambulatory Visit (HOSPITAL_BASED_OUTPATIENT_CLINIC_OR_DEPARTMENT_OTHER): Payer: Self-pay | Admitting: Psychiatry

## 2020-03-20 DIAGNOSIS — F902 Attention-deficit hyperactivity disorder, combined type: Secondary | ICD-10-CM | POA: Diagnosis not present

## 2020-03-20 DIAGNOSIS — Z79899 Other long term (current) drug therapy: Secondary | ICD-10-CM | POA: Diagnosis not present

## 2020-03-20 MED FILL — AMPHETAMINE-DEXTROAMPHETAMI: 20 | 30 days supply | Qty: 60 | Fill #0

## 2020-04-01 ENCOUNTER — Other Ambulatory Visit (HOSPITAL_BASED_OUTPATIENT_CLINIC_OR_DEPARTMENT_OTHER): Payer: Self-pay | Admitting: General Practice

## 2020-04-01 DIAGNOSIS — Z8679 Personal history of other diseases of the circulatory system: Secondary | ICD-10-CM | POA: Diagnosis not present

## 2020-04-01 DIAGNOSIS — I671 Cerebral aneurysm, nonruptured: Secondary | ICD-10-CM | POA: Diagnosis not present

## 2020-04-01 DIAGNOSIS — H35061 Retinal vasculitis, right eye: Secondary | ICD-10-CM | POA: Diagnosis not present

## 2020-04-01 DIAGNOSIS — R519 Headache, unspecified: Secondary | ICD-10-CM | POA: Diagnosis not present

## 2020-04-09 MED FILL — azaTHIOprine 50 MG TABS: 50 | 30 days supply | Qty: 60 | Fill #3

## 2020-04-09 MED FILL — OMEPRAZOLE 40 MG CPDR: 40 | 90 days supply | Qty: 90 | Fill #2

## 2020-04-16 ENCOUNTER — Other Ambulatory Visit (HOSPITAL_BASED_OUTPATIENT_CLINIC_OR_DEPARTMENT_OTHER): Payer: Self-pay | Admitting: Ophthalmology

## 2020-04-16 DIAGNOSIS — H319 Unspecified disorder of choroid: Secondary | ICD-10-CM | POA: Diagnosis not present

## 2020-04-16 DIAGNOSIS — H35033 Hypertensive retinopathy, bilateral: Secondary | ICD-10-CM | POA: Diagnosis not present

## 2020-04-16 DIAGNOSIS — H348312 Tributary (branch) retinal vein occlusion, right eye, stable: Secondary | ICD-10-CM | POA: Diagnosis not present

## 2020-04-16 DIAGNOSIS — I10 Essential (primary) hypertension: Secondary | ICD-10-CM | POA: Diagnosis not present

## 2020-04-16 DIAGNOSIS — Z79899 Other long term (current) drug therapy: Secondary | ICD-10-CM | POA: Diagnosis not present

## 2020-04-16 DIAGNOSIS — H30003 Unspecified focal chorioretinal inflammation, bilateral: Secondary | ICD-10-CM | POA: Diagnosis not present

## 2020-04-20 ENCOUNTER — Other Ambulatory Visit (HOSPITAL_BASED_OUTPATIENT_CLINIC_OR_DEPARTMENT_OTHER): Payer: Self-pay

## 2020-04-20 MED FILL — Amphetamine-Dextroamphetamine Cap ER 24HR 30 MG: ORAL | 30 days supply | Qty: 30 | Fill #0 | Status: AC

## 2020-04-20 MED FILL — Hydrochlorothiazide Tab 25 MG: ORAL | 90 days supply | Qty: 45 | Fill #0 | Status: AC

## 2020-05-04 ENCOUNTER — Other Ambulatory Visit (HOSPITAL_BASED_OUTPATIENT_CLINIC_OR_DEPARTMENT_OTHER): Payer: Self-pay

## 2020-05-04 DIAGNOSIS — H348312 Tributary (branch) retinal vein occlusion, right eye, stable: Secondary | ICD-10-CM | POA: Diagnosis not present

## 2020-05-04 DIAGNOSIS — D3130 Benign neoplasm of unspecified choroid: Secondary | ICD-10-CM | POA: Diagnosis not present

## 2020-05-04 DIAGNOSIS — H5711 Ocular pain, right eye: Secondary | ICD-10-CM | POA: Diagnosis not present

## 2020-05-04 DIAGNOSIS — H35061 Retinal vasculitis, right eye: Secondary | ICD-10-CM | POA: Diagnosis not present

## 2020-05-04 DIAGNOSIS — H16001 Unspecified corneal ulcer, right eye: Secondary | ICD-10-CM | POA: Diagnosis not present

## 2020-05-04 DIAGNOSIS — I671 Cerebral aneurysm, nonruptured: Secondary | ICD-10-CM | POA: Diagnosis not present

## 2020-05-04 DIAGNOSIS — H30003 Unspecified focal chorioretinal inflammation, bilateral: Secondary | ICD-10-CM | POA: Diagnosis not present

## 2020-05-04 DIAGNOSIS — H35033 Hypertensive retinopathy, bilateral: Secondary | ICD-10-CM | POA: Diagnosis not present

## 2020-05-04 DIAGNOSIS — Z79899 Other long term (current) drug therapy: Secondary | ICD-10-CM | POA: Diagnosis not present

## 2020-05-04 DIAGNOSIS — I10 Essential (primary) hypertension: Secondary | ICD-10-CM | POA: Diagnosis not present

## 2020-05-04 DIAGNOSIS — H318 Other specified disorders of choroid: Secondary | ICD-10-CM | POA: Diagnosis not present

## 2020-05-04 MED ORDER — MOXIFLOXACIN HCL 0.5 % OP SOLN
OPHTHALMIC | 1 refills | Status: DC
  Filled 2020-05-04: qty 3, 6d supply, fill #0
  Filled 2020-05-19: qty 3, 6d supply, fill #1

## 2020-05-04 MED ORDER — ATROPINE SULFATE 1 % OP SOLN
OPHTHALMIC | 0 refills | Status: AC
  Filled 2020-05-04: qty 5, 50d supply, fill #0

## 2020-05-06 ENCOUNTER — Other Ambulatory Visit (HOSPITAL_BASED_OUTPATIENT_CLINIC_OR_DEPARTMENT_OTHER): Payer: Self-pay

## 2020-05-06 DIAGNOSIS — H16001 Unspecified corneal ulcer, right eye: Secondary | ICD-10-CM | POA: Diagnosis not present

## 2020-05-06 DIAGNOSIS — H30003 Unspecified focal chorioretinal inflammation, bilateral: Secondary | ICD-10-CM | POA: Diagnosis not present

## 2020-05-06 DIAGNOSIS — H348312 Tributary (branch) retinal vein occlusion, right eye, stable: Secondary | ICD-10-CM | POA: Diagnosis not present

## 2020-05-06 MED ORDER — ERYTHROMYCIN 5 MG/GM OP OINT
TOPICAL_OINTMENT | OPHTHALMIC | 0 refills | Status: DC
  Filled 2020-05-06: qty 3.5, 10d supply, fill #0

## 2020-05-11 DIAGNOSIS — H35033 Hypertensive retinopathy, bilateral: Secondary | ICD-10-CM | POA: Diagnosis not present

## 2020-05-11 DIAGNOSIS — H348312 Tributary (branch) retinal vein occlusion, right eye, stable: Secondary | ICD-10-CM | POA: Diagnosis not present

## 2020-05-11 DIAGNOSIS — H16001 Unspecified corneal ulcer, right eye: Secondary | ICD-10-CM | POA: Diagnosis not present

## 2020-05-11 DIAGNOSIS — H30003 Unspecified focal chorioretinal inflammation, bilateral: Secondary | ICD-10-CM | POA: Diagnosis not present

## 2020-05-12 ENCOUNTER — Other Ambulatory Visit (HOSPITAL_BASED_OUTPATIENT_CLINIC_OR_DEPARTMENT_OTHER): Payer: Self-pay

## 2020-05-12 MED FILL — Azathioprine Tab 50 MG: ORAL | 30 days supply | Qty: 60 | Fill #0 | Status: AC

## 2020-05-17 DIAGNOSIS — H66002 Acute suppurative otitis media without spontaneous rupture of ear drum, left ear: Secondary | ICD-10-CM | POA: Diagnosis not present

## 2020-05-18 DIAGNOSIS — H16001 Unspecified corneal ulcer, right eye: Secondary | ICD-10-CM | POA: Diagnosis not present

## 2020-05-19 ENCOUNTER — Other Ambulatory Visit (HOSPITAL_BASED_OUTPATIENT_CLINIC_OR_DEPARTMENT_OTHER): Payer: Self-pay

## 2020-05-25 DIAGNOSIS — Z79899 Other long term (current) drug therapy: Secondary | ICD-10-CM | POA: Diagnosis not present

## 2020-05-25 DIAGNOSIS — H348312 Tributary (branch) retinal vein occlusion, right eye, stable: Secondary | ICD-10-CM | POA: Diagnosis not present

## 2020-05-25 DIAGNOSIS — H30003 Unspecified focal chorioretinal inflammation, bilateral: Secondary | ICD-10-CM | POA: Diagnosis not present

## 2020-05-25 DIAGNOSIS — H35033 Hypertensive retinopathy, bilateral: Secondary | ICD-10-CM | POA: Diagnosis not present

## 2020-05-25 DIAGNOSIS — H16001 Unspecified corneal ulcer, right eye: Secondary | ICD-10-CM | POA: Diagnosis not present

## 2020-05-27 ENCOUNTER — Ambulatory Visit: Payer: 59 | Admitting: Family

## 2020-05-27 ENCOUNTER — Other Ambulatory Visit (HOSPITAL_BASED_OUTPATIENT_CLINIC_OR_DEPARTMENT_OTHER): Payer: Self-pay

## 2020-05-27 ENCOUNTER — Other Ambulatory Visit: Payer: Self-pay

## 2020-05-27 ENCOUNTER — Encounter: Payer: Self-pay | Admitting: Family

## 2020-05-27 VITALS — BP 126/82 | HR 102 | Resp 18

## 2020-05-27 DIAGNOSIS — H6982 Other specified disorders of Eustachian tube, left ear: Secondary | ICD-10-CM | POA: Diagnosis not present

## 2020-05-27 MED ORDER — CETIRIZINE HCL 10 MG PO TABS: 10.0000 mg | ORAL_TABLET | Freq: Every day | ORAL | 11 refills | Status: AC

## 2020-05-27 MED ORDER — FLUTICASONE PROPIONATE 50 MCG/ACT NA SUSP: 2.0000 | Freq: Every day | NASAL | 6 refills | Status: AC

## 2020-05-27 MED FILL — Naproxen Tab 500 MG: ORAL | 30 days supply | Qty: 60 | Fill #0 | Status: AC

## 2020-05-27 MED FILL — Amphetamine-Dextroamphetamine Cap ER 24HR 30 MG: ORAL | 30 days supply | Qty: 30 | Fill #0 | Status: AC

## 2020-05-27 NOTE — Progress Notes (Signed)
Subjective:   By signing my name below, I, Shehryar Baig, attest that this documentation has been prepared under the direction and in the presence of Debbrah Alar NP. 05/27/2020     Patient ID: Ana Gray, female    DOB: 1976/01/01, 45 y.o.   MRN: [id number]  No chief complaint on file.   HPI Patient is in today for a office visit. She complains of left ear pain and pressure for the past month. She also feels there is water inside of her ear and occasionally gets dizzy. She went to the urgent care this past Sunday and they gave her antibiotics and ear drops to manage the symptoms. She denies having any ear discharge. She notes that her pain has improved following treatment but has not resolved.    Past Medical History:  Diagnosis Date  . ADHD (attention deficit hyperactivity disorder)   . AMA (advanced maternal age) multigravida 31+   . Anemia   . Asthma   . Breast mass in female    benign  . Edema   . Headache(784.0)   . Hypertension   . Immune deficiency disorder ([place])   . Loss of vision   . Obese   . Varicosities   . Vasculitis [facility])     Past Surgical History:  Procedure Laterality Date  . CHOLECYSTECTOMY    . CLOSED REDUCTION SHOULDER DISLOCATION     left  . COLPOSCOPY    . TUBAL LIGATION  01/22/2011   Procedure: POST PARTUM TUBAL LIGATION;  Surgeon: Eldred Manges, MD;  Location: [facility];  Service: Gynecology;  Laterality: Bilateral;  post partum tubal ligation bilateral    Family History  Problem Relation Age of Onset  . Other Other        non contributory  . Cancer Maternal Grandmother        throat  . Vision loss Cousin     Social History   Socioeconomic History  . Marital status: Single    Spouse name: Not on file  . Number of children: Not on file  . Years of education: Not on file  . Highest education level: Not on file  Occupational History  . Not on file  Tobacco Use  . Smoking status: Never Smoker  . Smokeless tobacco:  Never Used  Vaping Use  . Vaping Use: Never used  Substance and Sexual Activity  . Alcohol use: No  . Drug use: No  . Sexual activity: Yes    Birth control/protection: Surgical  Other Topics Concern  . Not on file  Social History Narrative   Works as a Museum/gallery curator at [employer]   3 children 1999 (daughter), 63 (daughter), 2013 (daughter)   Single   Completed some college   Grew up in [place] Strain: Not on [employer] Insecurity: Not on file  Transportation Needs: Not on file  Physical Activity: Not on file  Stress: Not on file  Social Connections: Not on file  Intimate Partner Violence: Not on file    Outpatient Medications Prior to Visit  Medication Sig Dispense Refill  . amoxicillin-clavulanate (AUGMENTIN) 875-125 MG tablet TAKE 1 TABLET BY MOUTH TWICE DAILY 20 tablet 0  . amphetamine-dextroamphetamine (ADDERALL XR) 30 MG 24 hr capsule TAKE 1 CAPSULE (30 MG TOTAL) BY MOUTH EVERY MORNING. *05/15/20* 30 capsule 0  . amphetamine-dextroamphetamine (ADDERALL XR) 30 MG 24 hr capsule TAKE 1 CAPSULE (30 MG TOTAL) BY MOUTH EVERY MORNING.  30 capsule 0  . amphetamine-dextroamphetamine (ADDERALL XR) 30 MG 24 hr capsule TAKE 1 CAPSULE (30 MG TOTAL) BY MOUTH EVERY MORNING. 30 capsule 0  . amphetamine-dextroamphetamine (ADDERALL XR) 30 MG 24 hr capsule TAKE 1 CAPSULE BY MOUTH EVERY MORNING 30 capsule 0  . amphetamine-dextroamphetamine (ADDERALL XR) 30 MG 24 hr capsule TAKE 1 CAPSULE BY MOUTH EVERY MORNING **01/17/20** 30 capsule 0  . amphetamine-dextroamphetamine (ADDERALL XR) 30 MG 24 hr capsule TAKE 1 CAPSULE BY MOUTH EVERY MORNING 30 capsule 0  . amphetamine-dextroamphetamine (ADDERALL) 20 MG tablet TAKE 1/2 TO 1 TABLET BY MOUTH AROUND 11:30AM AND 1/2 TO 1 TABLET AROUND 2:30PM *05/15/20* 60 tablet 0  . amphetamine-dextroamphetamine (ADDERALL) 20 MG tablet TAKE 1/2 TO 1 TABLET BY MOUTH AROUND 11:30AM AND 1/2 TO 1 TABLET AROUND 2:30PM  *04/17/20* 60 tablet 0  . amphetamine-dextroamphetamine (ADDERALL) 20 MG tablet TAKE 1/2 TO 1 TABLET BY MOUTH AROUND 11:30AM AND 1/2 TO 1 TABLET AROUND 2:30PM 60 tablet 0  . amphetamine-dextroamphetamine (ADDERALL) 20 MG tablet TAKE 1/2 - 1 TABLET BY MOUTH AROUND 11:30AM AND 1/2 - 1 TABLET AROUND 2:30PM 60 tablet 0  . atropine 1 % ophthalmic solution Place 1 drop into the right eye in the morning and at bedtime. 15 mL 0  . azaTHIOprine (IMURAN) 50 MG tablet TAKE 1 TABLET BY MOUTH TWICE DAILY 60 tablet 5  . azaTHIOprine (IMURAN) 50 MG tablet TAKE 1 TABLET BY MOUTH TWICE DAILY 60 tablet 5  . azaTHIOprine (IMURAN) 50 MG tablet TAKE 1 TABLET BY MOUTH TWICE DAILY 60 tablet 5  . baclofen (LIORESAL) 10 MG tablet Take 10 mg by mouth.    . benazepril (LOTENSIN) 40 MG tablet TAKE 1 TABLET (40 MG TOTAL) BY MOUTH DAILY. 90 tablet 3  . erythromycin ophthalmic ointment Place into the right eye 2 times daily. 3.5 g 0  . fluconazole (DIFLUCAN) 150 MG tablet TAKE ONE TABLET BY MOUTH TODAY, MAY REPEAT IN 4 DAYS 2 tablet 0  . fluconazole (DIFLUCAN) 150 MG tablet TAKE 1 TABLET BY MOUTH TODAY, MAY REPEAT IN 4 DAYS 2 tablet 0  . hydrochlorothiazide (HYDRODIURIL) 25 MG tablet TAKE 1/2 TABLET BY MOUTH DAILY 45 tablet 1  . hydrochlorothiazide (HYDRODIURIL) 25 MG tablet TAKE 1/2 TABLET (12.5 MG TOTAL) BY MOUTH DAILY. **APPT NEEDED WITH LORI BEANE FOR REFILLS** 15 tablet 0  . HYDROcodone-homatropine (HYCODAN) 5-1.5 MG/5ML syrup TAKE 5MLS BY MOUTH EVERY 4 HOURS AS NEEDED 120 mL 0  . ibuprofen (ADVIL,MOTRIN) 600 MG tablet Take 600 mg by mouth every 6 (six) hours as needed. Takes for pain    . methylphenidate 54 MG PO CR tablet Take 1 tablet (54 mg total) by mouth every morning. 30 tablet 0  . moxifloxacin (VIGAMOX) 0.5 % ophthalmic solution Place 1 drop into the right eye every 2 hours for 10 days. 3 mL 1  . naproxen (NAPROSYN) 500 MG tablet TAKE 1 TABLET BY MOUTH TWICE DAILY 60 tablet 2  . omeprazole (PRILOSEC) 40 MG capsule  TAKE 1 CAPSULE (40 MG TOTAL) BY MOUTH ONE TIME DAILY. 30 capsule 3  . omeprazole (PRILOSEC) 40 MG capsule TAKE 1 CAPSULE BY MOUTH ONCE DAILY 90 capsule 3  . potassium chloride SA (K-DUR,KLOR-CON) 20 MEQ tablet Take 1 tablet (20 mEq total) by mouth 2 (two) times daily. 10 tablet 0  . potassium chloride SA (KLOR-CON) 20 MEQ tablet TAKE 1 TABLET (20 MEQ TOTAL) BY MOUTH DAILY FOR 7 DAYS. 7 tablet 0  . predniSONE (DELTASONE) 10 MG tablet TAKE  6 TABS BY MOUTH ON DAY 1; 5 TABS ON DAY 2; 4 TABS ON DAY 3; 3 TABS ON DAY 4; 2 TABS ON DAY 5; 1 TAB ON DAY 6 THEN STOP 21 tablet 0  . scopolamine (TRANSDERM-SCOP) 1 MG/3DAYS PLACE 1 PATCH ONTO THE SKIN EVERY THIRD DAY. NOTE: 1.5 MG PATCH DELIVERS 1 MG OVER 3 DAYS 4 patch 0  . vitamin B-12 (CYANOCOBALAMIN) 100 MCG tablet Take 1 tablet (100 mcg total) by mouth daily. 90 tablet 1   No facility-administered medications prior to visit.    No Known Allergies  Review of Systems  HENT: Positive for ear pain. Negative for ear discharge.   Neurological: Positive for dizziness.       Objective:    Physical Exam Constitutional:      Appearance: Normal appearance.  HENT:     Head: Normocephalic and atraumatic.     Comments: Left TM is mildly pink. No bulging.     Right Ear: External ear normal.     Left Ear: External ear normal.  Eyes:     Extraocular Movements: Extraocular movements intact.     Pupils: Pupils are equal, round, and reactive to light.  Cardiovascular:     Rate and Rhythm: Normal rate and regular rhythm.     Pulses: Normal pulses.     Heart sounds: Normal heart sounds. No murmur heard. No friction rub. No gallop.   Pulmonary:     Effort: Pulmonary effort is normal. No respiratory distress.     Breath sounds: Normal breath sounds. No stridor. No wheezing, rhonchi or rales.  Skin:    General: Skin is warm and dry.  Neurological:     Mental Status: She is alert and oriented to person, place, and time.  Psychiatric:        Behavior:  Behavior normal.     There were no vitals taken for this visit. Wt Readings from Last 3 Encounters:  02/05/17 237 lb (107.5 kg)  01/21/15 232 lb 3.2 oz (105.3 kg)  10/28/14 210 lb 9.6 oz (95.5 kg)    Diabetic Foot Exam - Simple   No data filed    Lab Results  Component Value Date   WBC 20.0 (H) 02/05/2017   HGB 12.7 02/05/2017   HCT 38.6 02/05/2017   PLT 252 02/05/2017   GLUCOSE 102 (H) 02/05/2017   CHOL 151 10/28/2014   TRIG 57.0 10/28/2014   HDL 44.70 10/28/2014   LDLCALC 95 10/28/2014   ALT 9 10/28/2014   AST 13 10/28/2014   NA 140 02/05/2017   K 2.8 (L) 02/05/2017   CL 105 02/05/2017   CREATININE 0.89 02/05/2017   BUN 14 02/05/2017   CO2 26 02/05/2017   TSH 2.91 10/28/2014    Lab Results  Component Value Date   TSH 2.91 10/28/2014   Lab Results  Component Value Date   WBC 20.0 (H) 02/05/2017   HGB 12.7 02/05/2017   HCT 38.6 02/05/2017   MCV 92.1 02/05/2017   PLT 252 02/05/2017   Lab Results  Component Value Date   NA 140 02/05/2017   K 2.8 (L) 02/05/2017   CO2 26 02/05/2017   GLUCOSE 102 (H) 02/05/2017   BUN 14 02/05/2017   CREATININE 0.89 02/05/2017   BILITOT 0.7 10/28/2014   ALKPHOS 55 10/28/2014   AST 13 10/28/2014   ALT 9 10/28/2014   PROT 7.5 10/28/2014   ALBUMIN 4.0 10/28/2014   CALCIUM 8.8 (L) 02/05/2017   ANIONGAP 9 02/05/2017  GFR 82.86 10/28/2014   Lab Results  Component Value Date   CHOL 151 10/28/2014   Lab Results  Component Value Date   HDL 44.70 10/28/2014   Lab Results  Component Value Date   LDLCALC 95 10/28/2014   Lab Results  Component Value Date   TRIG 57.0 10/28/2014   Lab Results  Component Value Date   CHOLHDL 3 10/28/2014   No results found for: HGBA1C     Assessment & Plan:   Problem List Items Addressed This Visit   None      No orders of the defined types were placed in this encounter.   I, Debbrah Alar NP, personally preformed the services described in this documentation.  All  medical record entries made by the scribe were at my direction and in my presence.  I have reviewed the chart and discharge instructions (if applicable) and agree that the record reflects my personal performance and is accurate and complete. 05/27/2020   I,Shehryar Baig,acting as a Education administrator for Nance Pear, NP.,have documented all relevant documentation on the behalf of Nance Pear, NP,as directed by  Nance Pear, NP while in the presence of Nance Pear, NP.   Shehryar [employer]

## 2020-05-27 NOTE — Patient Instructions (Signed)
Please start flonase 2 sprays each nostril once daily and zyrtec 10mg  once daily. Call if symptoms worsen or if symptoms fail to improve.    Eustachian Tube Dysfunction  Eustachian tube dysfunction refers to a condition in which a blockage develops in the narrow passage that connects the middle ear to the back of the nose (eustachian tube). The eustachian tube regulates air pressure in the middle ear by letting air move between the ear and nose. It also helps to drain fluid from the middle ear space. Eustachian tube dysfunction can affect one or both ears. When the eustachian tube does not function properly, air pressure, fluid, or both can build up in the middle ear. What are the causes? This condition occurs when the eustachian tube becomes blocked or cannot open normally. Common causes of this condition include:  Ear infections.  Colds and other infections that affect the nose, mouth, and throat (upper respiratory tract).  Allergies.  Irritation from cigarette smoke.  Irritation from stomach acid coming up into the esophagus (gastroesophageal reflux). The esophagus is the tube that carries food from the mouth to the stomach.  Sudden changes in air pressure, such as from descending in an airplane or scuba diving.  Abnormal growths in the nose or throat, such as: ? Growths that line the nose (nasal polyps). ? Abnormal growth of cells (tumors). ? Enlarged tissue at the back of the throat (adenoids). What increases the risk? You are more likely to develop this condition if:  You smoke.  You are overweight.  You are a child who has: ? Certain birth defects of the mouth, such as cleft palate. ? Large tonsils or adenoids. What are the signs or symptoms? Common symptoms of this condition include:  A feeling of fullness in the ear.  Ear pain.  Clicking or popping noises in the ear.  Ringing in the ear.  Hearing loss.  Loss of balance.  Dizziness. Symptoms may get worse  when the air pressure around you changes, such as when you travel to an area of high elevation, fly on an airplane, or go scuba diving. How is this diagnosed? This condition may be diagnosed based on:  Your symptoms.  A physical exam of your ears, nose, and throat.  Tests, such as those that measure: ? The movement of your eardrum (tympanogram). ? Your hearing (audiometry). How is this treated? Treatment depends on the cause and severity of your condition.  In mild cases, you may relieve your symptoms by moving air into your ears. This is called "popping the ears."  In more severe cases, or if you have symptoms of fluid in your ears, treatment may include: ? Medicines to relieve congestion (decongestants). ? Medicines that treat allergies (antihistamines). ? Nasal sprays or ear drops that contain medicines that reduce swelling (steroids). ? A procedure to drain the fluid in your eardrum (myringotomy). In this procedure, a small tube is placed in the eardrum to:  Drain the fluid.  Restore the air in the middle ear space. ? A procedure to insert a balloon device through the nose to inflate the opening of the eustachian tube (balloon dilation). Follow these instructions at home: Lifestyle  Do not do any of the following until your health care provider approves: ? Travel to high altitudes. ? Fly in airplanes. ? Work in a [employer] scheme manager or room. ? Scuba dive.  Do not use any products that contain nicotine or tobacco, such as cigarettes and e-cigarettes. If you need help quitting,  ask your health care provider.  Keep your ears dry. Wear fitted earplugs during showering and bathing. Dry your ears completely after. General instructions  Take over-the-counter and prescription medicines only as told by your health care provider.  Use techniques to help pop your ears as recommended by your health care provider. These may include: ? Chewing gum. ? Yawning. ? Frequent, forceful  swallowing. ? Closing your mouth, holding your nose closed, and gently blowing as if you are trying to blow air out of your nose.  Keep all follow-up visits as told by your health care provider. This is important. Contact a health care provider if:  Your symptoms do not go away after treatment.  Your symptoms come back after treatment.  You are unable to pop your ears.  You have: ? A fever. ? Pain in your ear. ? Pain in your head or neck. ? Fluid draining from your ear.  Your hearing suddenly changes.  You become very dizzy.  You lose your balance. Summary  Eustachian tube dysfunction refers to a condition in which a blockage develops in the eustachian tube.  It can be caused by ear infections, allergies, inhaled irritants, or abnormal growths in the nose or throat.  Symptoms include ear pain, hearing loss, or ringing in the ears.  Mild cases are treated with maneuvers to unblock the ears, such as yawning or ear popping.  Severe cases are treated with medicines. Surgery may also be done (rare). This information is not intended to replace advice given to you by your health care provider. Make sure you discuss any questions you have with your health care provider. Document Revised: 04/25/2017 Document Reviewed: 04/25/2017 Elsevier Patient Education  [employer].

## 2020-05-28 DIAGNOSIS — H6992 Unspecified Eustachian tube disorder, left ear: Secondary | ICD-10-CM | POA: Insufficient documentation

## 2020-05-28 DIAGNOSIS — H6982 Other specified disorders of Eustachian tube, left ear: Secondary | ICD-10-CM | POA: Insufficient documentation

## 2020-05-28 NOTE — Assessment & Plan Note (Signed)
Pt is advised as follows:  Please start flonase 2 sprays each nostril once daily and zyrtec 10mg  once daily. Call if symptoms worsen or if symptoms fail to improve.

## 2020-06-08 ENCOUNTER — Other Ambulatory Visit (HOSPITAL_BASED_OUTPATIENT_CLINIC_OR_DEPARTMENT_OTHER): Payer: Self-pay

## 2020-06-08 DIAGNOSIS — H9202 Otalgia, left ear: Secondary | ICD-10-CM | POA: Diagnosis not present

## 2020-06-08 DIAGNOSIS — H60332 Swimmer's ear, left ear: Secondary | ICD-10-CM | POA: Diagnosis not present

## 2020-06-08 MED ORDER — CIPROFLOXACIN-DEXAMETHASONE 0.3-0.1 % OT SUSP
OTIC | 0 refills | Status: DC
  Filled 2020-06-08: qty 7.5, 19d supply, fill #0

## 2020-06-08 MED FILL — Benazepril HCl Tab 40 MG: ORAL | 90 days supply | Qty: 90 | Fill #0 | Status: AC

## 2020-06-16 ENCOUNTER — Other Ambulatory Visit (HOSPITAL_BASED_OUTPATIENT_CLINIC_OR_DEPARTMENT_OTHER): Payer: Self-pay

## 2020-06-16 MED FILL — Azathioprine Tab 50 MG: ORAL | 30 days supply | Qty: 60 | Fill #1 | Status: AC

## 2020-06-17 ENCOUNTER — Other Ambulatory Visit (HOSPITAL_BASED_OUTPATIENT_CLINIC_OR_DEPARTMENT_OTHER): Payer: Self-pay

## 2020-06-17 DIAGNOSIS — F902 Attention-deficit hyperactivity disorder, combined type: Secondary | ICD-10-CM | POA: Diagnosis not present

## 2020-06-17 DIAGNOSIS — Z79899 Other long term (current) drug therapy: Secondary | ICD-10-CM | POA: Diagnosis not present

## 2020-06-17 MED ORDER — AMPHETAMINE-DEXTROAMPHETAMINE 20 MG PO TABS
ORAL_TABLET | ORAL | 0 refills | Status: DC
  Filled 2020-06-17: qty 60, 30d supply, fill #0

## 2020-06-17 MED ORDER — AMPHETAMINE-DEXTROAMPHET ER 30 MG PO CP24: 30.0000 mg | ORAL_CAPSULE | Freq: Every morning | ORAL | 0 refills | Status: DC

## 2020-06-17 MED ORDER — AMPHETAMINE-DEXTROAMPHET ER 30 MG PO CP24
30.0000 mg | ORAL_CAPSULE | Freq: Every morning | ORAL | 0 refills | Status: DC
  Filled 2020-06-17 – 2020-06-30 (×2): qty 30, 30d supply, fill #0

## 2020-06-17 MED ORDER — AMPHETAMINE-DEXTROAMPHET ER 30 MG PO CP24
30.0000 mg | ORAL_CAPSULE | Freq: Every morning | ORAL | 0 refills | Status: DC
  Filled ????-??-??: fill #0

## 2020-06-18 ENCOUNTER — Other Ambulatory Visit (HOSPITAL_BASED_OUTPATIENT_CLINIC_OR_DEPARTMENT_OTHER): Payer: Self-pay

## 2020-06-18 DIAGNOSIS — H938X2 Other specified disorders of left ear: Secondary | ICD-10-CM | POA: Diagnosis not present

## 2020-06-18 DIAGNOSIS — H918X2 Other specified hearing loss, left ear: Secondary | ICD-10-CM | POA: Diagnosis not present

## 2020-06-18 MED ORDER — CIPROFLOXACIN-DEXAMETHASONE 0.3-0.1 % OT SUSP
OTIC | 0 refills | Status: DC
  Filled 2020-06-18: qty 7.5, 7d supply, fill #0
  Filled 2020-06-19 – 2020-06-23 (×2): qty 7.5, 19d supply, fill #0

## 2020-06-19 ENCOUNTER — Other Ambulatory Visit (HOSPITAL_BASED_OUTPATIENT_CLINIC_OR_DEPARTMENT_OTHER): Payer: Self-pay

## 2020-06-23 ENCOUNTER — Other Ambulatory Visit (HOSPITAL_BASED_OUTPATIENT_CLINIC_OR_DEPARTMENT_OTHER): Payer: Self-pay

## 2020-06-30 ENCOUNTER — Other Ambulatory Visit (HOSPITAL_BASED_OUTPATIENT_CLINIC_OR_DEPARTMENT_OTHER): Payer: Self-pay

## 2020-06-30 MED FILL — Omeprazole Cap Delayed Release 40 MG: ORAL | 90 days supply | Qty: 90 | Fill #0 | Status: AC

## 2020-07-09 ENCOUNTER — Other Ambulatory Visit (HOSPITAL_BASED_OUTPATIENT_CLINIC_OR_DEPARTMENT_OTHER): Payer: Self-pay

## 2020-07-09 DIAGNOSIS — H348312 Tributary (branch) retinal vein occlusion, right eye, stable: Secondary | ICD-10-CM | POA: Diagnosis not present

## 2020-07-09 DIAGNOSIS — Z79899 Other long term (current) drug therapy: Secondary | ICD-10-CM | POA: Diagnosis not present

## 2020-07-09 DIAGNOSIS — H30033 Focal chorioretinal inflammation, peripheral, bilateral: Secondary | ICD-10-CM | POA: Diagnosis not present

## 2020-07-09 DIAGNOSIS — H30003 Unspecified focal chorioretinal inflammation, bilateral: Secondary | ICD-10-CM | POA: Diagnosis not present

## 2020-07-09 DIAGNOSIS — H35371 Puckering of macula, right eye: Secondary | ICD-10-CM | POA: Diagnosis not present

## 2020-07-09 DIAGNOSIS — H319 Unspecified disorder of choroid: Secondary | ICD-10-CM | POA: Diagnosis not present

## 2020-07-09 DIAGNOSIS — H35033 Hypertensive retinopathy, bilateral: Secondary | ICD-10-CM | POA: Diagnosis not present

## 2020-07-09 MED ORDER — AZATHIOPRINE 50 MG PO TABS
ORAL_TABLET | ORAL | 5 refills | Status: DC
  Filled 2020-07-09 – 2020-07-17 (×2): qty 60, 30d supply, fill #0
  Filled 2020-08-11: qty 60, 30d supply, fill #1

## 2020-07-15 DIAGNOSIS — Z111 Encounter for screening for respiratory tuberculosis: Secondary | ICD-10-CM | POA: Diagnosis not present

## 2020-07-15 DIAGNOSIS — Z789 Other specified health status: Secondary | ICD-10-CM | POA: Diagnosis not present

## 2020-07-15 DIAGNOSIS — Z9229 Personal history of other drug therapy: Secondary | ICD-10-CM | POA: Diagnosis not present

## 2020-07-15 DIAGNOSIS — Z8619 Personal history of other infectious and parasitic diseases: Secondary | ICD-10-CM | POA: Diagnosis not present

## 2020-07-17 ENCOUNTER — Other Ambulatory Visit (HOSPITAL_BASED_OUTPATIENT_CLINIC_OR_DEPARTMENT_OTHER): Payer: Self-pay

## 2020-07-21 ENCOUNTER — Other Ambulatory Visit (HOSPITAL_BASED_OUTPATIENT_CLINIC_OR_DEPARTMENT_OTHER): Payer: Self-pay

## 2020-07-22 ENCOUNTER — Other Ambulatory Visit (HOSPITAL_BASED_OUTPATIENT_CLINIC_OR_DEPARTMENT_OTHER): Payer: Self-pay

## 2020-07-22 MED ORDER — HYDROCHLOROTHIAZIDE 25 MG PO TABS
12.5000 mg | ORAL_TABLET | Freq: Every day | ORAL | 1 refills | Status: DC
  Filled 2020-07-22: qty 45, 90d supply, fill #0

## 2020-08-11 ENCOUNTER — Other Ambulatory Visit (HOSPITAL_BASED_OUTPATIENT_CLINIC_OR_DEPARTMENT_OTHER): Payer: Self-pay

## 2020-08-11 MED FILL — Amphetamine-Dextroamphetamine Cap ER 24HR 30 MG: ORAL | 30 days supply | Qty: 30 | Fill #0 | Status: AC

## 2020-08-13 ENCOUNTER — Encounter: Payer: Self-pay | Admitting: General Practice

## 2020-08-13 ENCOUNTER — Other Ambulatory Visit (HOSPITAL_BASED_OUTPATIENT_CLINIC_OR_DEPARTMENT_OTHER): Payer: Self-pay

## 2020-08-13 ENCOUNTER — Ambulatory Visit: Payer: 59 | Admitting: Internal Medicine

## 2020-08-13 ENCOUNTER — Encounter: Payer: Self-pay | Admitting: Internal Medicine

## 2020-08-13 ENCOUNTER — Other Ambulatory Visit: Payer: Self-pay

## 2020-08-13 VITALS — BP 126/102 | HR 102 | Temp 98.1°F | Resp 16 | Ht 65.75 in | Wt 239.0 lb

## 2020-08-13 DIAGNOSIS — J029 Acute pharyngitis, unspecified: Secondary | ICD-10-CM | POA: Diagnosis not present

## 2020-08-13 MED ORDER — AZELASTINE HCL 0.1 % NA SOLN
2.0000 | Freq: Two times a day (BID) | NASAL | 6 refills | Status: AC
  Filled 2020-08-13: qty 30, 30d supply, fill #0

## 2020-08-13 NOTE — Progress Notes (Signed)
Subjective:    Patient ID: Ana Gray, female    DOB: 08/15/1975, 45 y.o.   MRN: [id number]  DOS:  08/13/2020 Type of visit - description: Acute The patient developed laryngitis approximately 2 weeks ago, sinus congestion, loss of her voice. She is now feeling better except for persistent sore throat.  She denies fever chills Minimal cough if any. Some postnasal dripping Eating and drinking without problems GERD symptoms controlled Some headaches in the morning  Review of Systems See above   Past Medical History:  Diagnosis Date   ADHD (attention deficit hyperactivity disorder)    AMA (advanced maternal age) multigravida 35+    Anemia    Asthma    Breast mass in female    benign   Edema    Headache(784.0)    Hypertension    Immune deficiency disorder (HCC)    Loss of vision    Obese    Varicosities    Vasculitis ([place])     Past Surgical History:  Procedure Laterality Date   CHOLECYSTECTOMY     CLOSED REDUCTION SHOULDER DISLOCATION     left   COLPOSCOPY     TUBAL LIGATION  01/22/2011   Procedure: POST PARTUM TUBAL LIGATION;  Surgeon: Eldred Manges, MD;  Location: [facility];  Service: Gynecology;  Laterality: Bilateral;  post partum tubal ligation bilateral    Allergies as of 08/13/2020   No Known Allergies      Medication List        Accurate as of August 13, 2020 11:59 PM. If you have any questions, ask your nurse or doctor.          Adderall XR 30 MG 24 hr capsule Generic drug: amphetamine-dextroamphetamine TAKE 1 CAPSULE (30 MG TOTAL) BY MOUTH EVERY MORNING. *05/15/20* What changed: Another medication with the same name was removed. Continue taking this medication, and follow the directions you see here. Changed by: Kathlene November, MD   amphetamine-dextroamphetamine 20 MG tablet Commonly known as: ADDERALL TAKE 1/2 TO 1 TABLET BY MOUTH AROUND 11:30AM AND 1/2 TO 1 TABLET AROUND 2:30PM *04/17/20* What changed: Another medication with the same name  was removed. Continue taking this medication, and follow the directions you see here. Changed by: Kathlene November, MD   atropine 1 % ophthalmic solution Place 1 drop into the right eye in the morning and at bedtime.   azaTHIOprine 50 MG tablet Commonly known as: IMURAN TAKE 1 TABLET BY MOUTH TWICE DAILY What changed: Another medication with the same name was removed. Continue taking this medication, and follow the directions you see here. Changed by: Kathlene November, MD   azelastine 0.1 % nasal spray Commonly known as: ASTELIN Place 2 sprays into both nostrils 2 (two) times daily. Started by: Kathlene November, MD   baclofen 10 MG tablet Commonly known as: LIORESAL Take 10 mg by mouth.   benazepril 40 MG tablet Commonly known as: LOTENSIN TAKE 1 TABLET (40 MG TOTAL) BY MOUTH DAILY.   cetirizine 10 MG tablet Commonly known as: ZYRTEC Take 1 tablet (10 mg total) by mouth daily.   ciprofloxacin-dexamethasone OTIC suspension Commonly known as: CIPRODEX Place 4 drops into the left ear 2 times daily.   erythromycin ophthalmic ointment Place into the right eye 2 times daily.   fluticasone 50 MCG/ACT nasal spray Commonly known as: FLONASE Place 2 sprays into both nostrils daily.   hydrochlorothiazide 25 MG tablet Commonly known as: HYDRODIURIL TAKE 1/2 TABLET (12.5 MG TOTAL) BY MOUTH DAILY. **APPT NEEDED  WITH LORI BEANE FOR REFILLS** What changed: Another medication with the same name was removed. Continue taking this medication, and follow the directions you see here. Changed by: Kathlene November, MD   ibuprofen 600 MG tablet Commonly known as: ADVIL Take 600 mg by mouth every 6 (six) hours as needed. Takes for pain   methylphenidate 54 MG CR tablet Commonly known as: CONCERTA Take 1 tablet (54 mg total) by mouth every morning.   moxifloxacin 0.5 % ophthalmic solution Commonly known as: VIGAMOX Place 1 drop into the right eye every 2 hours for 10 days.   naproxen 500 MG tablet Commonly known as:  NAPROSYN TAKE 1 TABLET BY MOUTH TWICE DAILY   omeprazole 40 MG capsule Commonly known as: PRILOSEC TAKE 1 CAPSULE (40 MG TOTAL) BY MOUTH ONE TIME DAILY. What changed: Another medication with the same name was removed. Continue taking this medication, and follow the directions you see here. Changed by: Kathlene November, MD   potassium chloride SA 20 MEQ tablet Commonly known as: KLOR-CON Take 1 tablet (20 mEq total) by mouth 2 (two) times daily. What changed: Another medication with the same name was removed. Continue taking this medication, and follow the directions you see here. Changed by: Kathlene November, MD   predniSONE 10 MG tablet Commonly known as: DELTASONE TAKE 6 TABS BY MOUTH ON DAY 1; 5 TABS ON DAY 2; 4 TABS ON DAY 3; 3 TABS ON DAY 4; 2 TABS ON DAY 5; 1 TAB ON DAY 6 THEN STOP   scopolamine 1 MG/3DAYS Commonly known as: TRANSDERM-SCOP PLACE 1 PATCH ONTO THE SKIN EVERY THIRD DAY. NOTE: 1.5 MG PATCH DELIVERS 1 MG OVER 3 DAYS   vitamin B-12 100 MCG tablet Commonly known as: CYANOCOBALAMIN Take 1 tablet (100 mcg total) by mouth daily.           Objective:   Physical Exam BP (!) 126/102 (BP Location: Left Arm, Patient Position: Sitting, Cuff Size: Small)   Pulse (!) 102   Temp 98.1 F (36.7 C) (Oral)   Resp 16   Ht 5' 5.75" (1.67 m)   Wt 239 lb (108.4 kg)   LMP 07/14/2020 (Approximate)   SpO2 98%   BMI 38.87 kg/m  General:   Well developed, NAD, BMI noted. HEENT:  Normocephalic . Face symmetric, atraumatic. TMs: Both are slightly bulged but without redness Throat: Symmetric, uvula is small and midline, tonsils normal size, no redness (there is a single tonsillolith about 1 or 2 mm on the left). Rest of the mouth is normal. Neck: No thyromegaly, no lymphadenopathies, neck is symmetric.    Lower extremities: no pretibial edema bilaterally  Skin: Not pale. Not jaundice Neurologic:  alert & oriented X3.  Speech normal, gait appropriate for age and unassisted Psych--   Cognition and judgment appear intact.  Cooperative with normal attention span and concentration.  Behavior appropriate. No anxious or depressed appearing.      Assessment     45 year old female, PMH includes ADD, migraine headaches, GERD, hypokalemia,   on Imuran rx  by ophthalmology presents with:  Sore throat: As described above, exam is benign, this is happening in the context of recent laryngitis that resolve and residual postnasal dripping. Plan:  Treat with Astelin, Flonase.   She also is experiencing some morning headaches, I wonder if residual nasal congestion make her mouth breathing  throughout the night and that is causing the symptoms. Denies a snoring or chronic fatigue.   This visit occurred during the SARS-CoV-2  public health emergency.  Safety protocols were in place, including screening questions prior to the visit, additional usage of staff PPE, and extensive cleaning of exam room while observing appropriate contact time as indicated for disinfecting solutions.

## 2020-08-13 NOTE — Patient Instructions (Signed)
Over-the-counter flonase 2 puffs a day  with results follow-up in all 3  Astelin  2 puffs on each side of the nose twice daily  Call if no better in 2 weeks

## 2020-08-14 ENCOUNTER — Other Ambulatory Visit (HOSPITAL_BASED_OUTPATIENT_CLINIC_OR_DEPARTMENT_OTHER): Payer: Self-pay

## 2020-08-14 MED ORDER — NAPROXEN 500 MG PO TABS
500.0000 mg | ORAL_TABLET | Freq: Two times a day (BID) | ORAL | 1 refills | Status: AC
  Filled 2020-08-14: qty 60, 30d supply, fill #0

## 2020-08-17 ENCOUNTER — Other Ambulatory Visit (HOSPITAL_BASED_OUTPATIENT_CLINIC_OR_DEPARTMENT_OTHER): Payer: Self-pay

## 2020-08-24 ENCOUNTER — Other Ambulatory Visit (HOSPITAL_BASED_OUTPATIENT_CLINIC_OR_DEPARTMENT_OTHER): Payer: Self-pay

## 2020-09-16 ENCOUNTER — Other Ambulatory Visit (HOSPITAL_BASED_OUTPATIENT_CLINIC_OR_DEPARTMENT_OTHER): Payer: Self-pay

## 2020-09-17 ENCOUNTER — Other Ambulatory Visit (HOSPITAL_BASED_OUTPATIENT_CLINIC_OR_DEPARTMENT_OTHER): Payer: Self-pay

## 2020-09-24 ENCOUNTER — Other Ambulatory Visit (HOSPITAL_BASED_OUTPATIENT_CLINIC_OR_DEPARTMENT_OTHER): Payer: Self-pay

## 2020-09-29 ENCOUNTER — Other Ambulatory Visit (HOSPITAL_BASED_OUTPATIENT_CLINIC_OR_DEPARTMENT_OTHER): Payer: Self-pay

## 2020-09-29 MED ORDER — AZATHIOPRINE 50 MG PO TABS
ORAL_TABLET | ORAL | 0 refills | Status: AC
  Filled 2020-09-29: qty 60, 30d supply, fill #0

## 2020-09-30 ENCOUNTER — Other Ambulatory Visit (HOSPITAL_BASED_OUTPATIENT_CLINIC_OR_DEPARTMENT_OTHER): Payer: Self-pay

## 2020-09-30 MED ORDER — OMEPRAZOLE 40 MG PO CPDR
40.0000 mg | DELAYED_RELEASE_CAPSULE | Freq: Every day | ORAL | 1 refills | Status: AC
  Filled 2020-09-30: qty 90, 90d supply, fill #0

## 2020-10-08 ENCOUNTER — Other Ambulatory Visit (HOSPITAL_BASED_OUTPATIENT_CLINIC_OR_DEPARTMENT_OTHER): Payer: Self-pay
